# Patient Record
Sex: Female | Born: 1950 | Race: Black or African American | Hispanic: No | Smoking: Never smoker
Health system: Southern US, Community
[De-identification: ages and names within clinical notes are randomized; demographics above are authoritative.]

## PROBLEM LIST (undated history)

## (undated) DIAGNOSIS — I10 Essential (primary) hypertension: Secondary | ICD-10-CM

## (undated) DIAGNOSIS — F329 Major depressive disorder, single episode, unspecified: Secondary | ICD-10-CM

## (undated) DIAGNOSIS — F419 Anxiety disorder, unspecified: Secondary | ICD-10-CM

## (undated) DIAGNOSIS — F32A Depression, unspecified: Secondary | ICD-10-CM

## (undated) HISTORY — DX: Major depressive disorder, single episode, unspecified: F32.9

## (undated) HISTORY — DX: Essential (primary) hypertension: I10

## (undated) HISTORY — DX: Anxiety disorder, unspecified: F41.9

## (undated) HISTORY — DX: Depression, unspecified: F32.A

---

## 2002-10-30 ENCOUNTER — Other Ambulatory Visit: Admission: RE | Admit: 2002-10-30 | Discharge: 2002-10-30 | Payer: Self-pay | Admitting: Gynecology

## 2004-03-15 ENCOUNTER — Ambulatory Visit (HOSPITAL_COMMUNITY): Admission: RE | Admit: 2004-03-15 | Discharge: 2004-03-15 | Payer: Self-pay | Admitting: Urology

## 2010-02-07 ENCOUNTER — Encounter (INDEPENDENT_AMBULATORY_CARE_PROVIDER_SITE_OTHER): Payer: Self-pay | Admitting: *Deleted

## 2010-03-17 ENCOUNTER — Encounter (INDEPENDENT_AMBULATORY_CARE_PROVIDER_SITE_OTHER): Payer: Self-pay | Admitting: *Deleted

## 2010-03-17 ENCOUNTER — Ambulatory Visit: Payer: Self-pay | Admitting: Gastroenterology

## 2010-03-17 DIAGNOSIS — R1032 Left lower quadrant pain: Secondary | ICD-10-CM

## 2010-04-21 ENCOUNTER — Ambulatory Visit
Admission: RE | Admit: 2010-04-21 | Discharge: 2010-04-21 | Payer: Self-pay | Source: Home / Self Care | Attending: Gastroenterology | Admitting: Gastroenterology

## 2010-04-21 ENCOUNTER — Encounter: Payer: Self-pay | Admitting: Gastroenterology

## 2010-05-09 NOTE — Letter (Signed)
Summary: New Patient letter  [facility]  [address] [place], [place] [address]   Phone: [phone]  Fax: [phone]       02/07/2010 MRN: [id number]  Margaret Murphy [address] [place], [place]  [address]  Dear Ms. Steffek,  Welcome to the Gastroenterology Division at [facility].    You are scheduled to see Dr. Christella Hartigan on 03/17/2010 at 10:00AM on the 3rd floor at [facility], [address]  We ask that you try to arrive at our office 15 minutes prior to your appointment time to allow for check-in.  We would like you to complete the enclosed self-administered evaluation form prior to your visit and bring it with you on the day of your appointment.  We will review it with you.  Also, please bring a complete list of all your medications or, if you prefer, bring the medication bottles and we will list them.  Please bring your insurance card so that we may make a copy of it.  If your insurance requires a referral to see a specialist, please bring your referral form from your primary care physician.  Co-payments are due at the time of your visit and may be paid by cash, check or credit card.     Your office visit will consist of a consult with your physician (includes a physical exam), any laboratory testing he/she may order, scheduling of any necessary diagnostic testing (e.g. x-ray, ultrasound, CT-scan), and scheduling of a procedure (e.g. Endoscopy, Colonoscopy) if required.  Please allow enough time on your schedule to allow for any/all of these possibilities.    If you cannot keep your appointment, please call [phone] to cancel or reschedule prior to your appointment date.  This allows [name] the opportunity to schedule an appointment for another patient in need of care.  If you do not cancel or reschedule by 5 p.m. the business day prior to your appointment date, you will be charged a $50.00 late cancellation/no-show fee.    Thank you for choosing [facility] for your medical needs.  We appreciate the opportunity to care for you.  Please visit [name] at our website  to learn more about our practice.                     Sincerely,                                                             The Gastroenterology Division

## 2010-05-09 NOTE — Letter (Signed)
Summary: [facility] Instructions  [facility]  [address] [place], [place] [address]   Phone: [phone]  Fax: [phone]       Margaret Murphy    1951-01-27    MRN: [id number]        Procedure Day /Date:04/21/10  FRI     Arrival Time:230 pm     Procedure Time:330 pm     Location of Procedure:                    X  [facility] (4th Floor)                        PREPARATION FOR COLONOSCOPY WITH MOVIPREP   Starting 5 days prior to your procedure 04/16/10  do not eat nuts, seeds, popcorn, corn, beans, peas,  salads, or any raw vegetables.  Do not take any fiber supplements (e.g. Metamucil, Citrucel, and Benefiber).  THE DAY BEFORE YOUR PROCEDURE         DATE: 04/20/10  DAY: THURS  1.  Drink clear liquids the entire day-NO SOLID FOOD  2.  Do not drink anything colored red or purple.  Avoid juices with pulp.  No orange juice.  3.  Drink at least 64 oz. (8 glasses) of fluid/clear liquids during the day to prevent dehydration and help the prep work efficiently.  CLEAR LIQUIDS INCLUDE: Water Jello Ice Popsicles Tea (sugar ok, no milk/cream) Powdered fruit flavored drinks Coffee (sugar ok, no milk/cream) Gatorade Juice: apple, white grape, white cranberry  Lemonade Clear bullion, consomm, broth Carbonated beverages (any kind) Strained chicken noodle soup Hard Candy                             4.  In the morning, mix first dose of MoviPrep solution:    Empty 1 Pouch A and 1 Pouch B into the disposable container    Add lukewarm drinking water to the top line of the container. Mix to dissolve    Refrigerate (mixed solution should be used within 24 hrs)  5.  Begin drinking the prep at 5:00 p.m. The MoviPrep container is divided by 4 marks.   Every 15 minutes drink the solution down to the next mark (approximately 8 oz) until the full liter is complete.   6.  Follow completed prep with 16 oz of clear liquid of your choice (Nothing red or  purple).  Continue to drink clear liquids until bedtime.  7.  Before going to bed, mix second dose of MoviPrep solution:    Empty 1 Pouch A and 1 Pouch B into the disposable container    Add lukewarm drinking water to the top line of the container. Mix to dissolve    Refrigerate  THE DAY OF YOUR PROCEDURE      DATE: 04/21/10 DAY: THURS  Beginning at 1030 a.m. (5 hours before procedure):         1. Every 15 minutes, drink the solution down to the next mark (approx 8 oz) until the full liter is complete.  2. Follow completed prep with 16 oz. of clear liquid of your choice.    3. You may drink clear liquids until 130 pm  (2 HOURS BEFORE PROCEDURE).   MEDICATION INSTRUCTIONS  Unless otherwise instructed, you should take regular prescription medications with a small sip of water   as early as possible the morning of your  procedure.           OTHER INSTRUCTIONS  You will need a responsible adult at least 60 years of age to accompany you and drive you home.   This person must remain in the waiting room during your procedure.  Wear loose fitting clothing that is easily removed.  Leave jewelry and other valuables at home.  However, you may wish to bring a book to read or  an iPod/MP3 player to listen to music as you wait for your procedure to start.  Remove all body piercing jewelry and leave at home.  Total time from sign-in until discharge is approximately 2-3 hours.  You should go home directly after your procedure and rest.  You can resume normal activities the  day after your procedure.  The day of your procedure you should not:   Drive   Make legal decisions   Operate machinery   Drink alcohol   Return to work  You will receive specific instructions about eating, activities and medications before you leave.    The above instructions have been reviewed and explained to me by   _______________________    I fully understand and can verbalize these  instructions _____________________________ Date _________

## 2010-05-09 NOTE — Assessment & Plan Note (Signed)
History of Present Illness Visit Type: Initial Consult Primary GI MD: Margaret Bunting MD Primary Provider: Leslie Andrea, MD Requesting Provider: Harold Hedge, MD Chief Complaint: LLQ off/on History of Present Illness:     very pleasant 60 year old woman who has had LLQ discomforts for about 2-3 years, intermittent.  When it occurs it is cramp-like, can last for days.  Sometimes will waken her from sleep.  Moving her bowels ??effect on symptoms.  She is pretty regular with bowel, never bleeding.  Rare constipation at all.  Never had a colonoscopy.  Overall stable weight.           Current Medications (verified): 1)  Amlodipine Besylate 2.5 Mg Tabs (Amlodipine Besylate) .... Once Daily  Allergies (verified): 1)  ! Sulfa  Past History:  Past Medical History: Anemia Chronic Headaches Urinary Tract Infection Hypertension    Past Surgical History: c section 1989  Family History: no colon cancer Diabetes  Social History: she is married, she has 2 children, she is a retired Licensed conveyancer school, she does not smoke cigarettes or drink alcohol  Review of Systems       Pertinent positive and negative review of systems were noted in the above HPI and GI specific review of systems.  All other review of systems was otherwise negative.   Vital Signs:  Patient profile:   60 year old female Height:      64 inches Weight:      126 pounds BMI:     21.71 Pulse rate:   68 / minute Pulse rhythm:   regular BP sitting:   140 / 76  (left arm) Cuff size:   regular  Vitals Entered By: Margaret McMurray CMA Duncan Murphy) (March 17, 2010 10:00 AM)  Physical Exam  Additional Exam:  Constitutional: generally well appearing Psychiatric: alert and oriented times 3 Eyes: extraocular movements intact Mouth: oropharynx moist, no lesions Neck: supple, no lymphadenopathy Cardiovascular: heart regular rate and rythm Lungs: CTA bilaterally Abdomen: soft, non-tender, non-distended,  no obvious ascites, no peritoneal signs, normal bowel sounds Extremities: no lower extremity edema bilaterally Skin: no lesions on visible extremities    Impression & Recommendations:  Problem # 1:  left lower quadrant discomforts this may be diverticular in origin however none of her episodes really seems to be as significant as an acute diverticulitis. Perhaps this is simply IBS-like discomforts. She will try sub-lingual antispasmodics on an as-needed basis and we will arrange for a colonoscopy to be performed at her soonest convenience.  Patient Instructions: 1)  You will be scheduled to have a colonoscopy. 2)  Trial of antispasm meds, called into your pharmacy. 3)  The medication list was reviewed and reconciled.  All changed / newly prescribed medications were explained.  A complete medication list was provided to the patient / caregiver. Prescriptions: LEVSIN/SL 0.125 MG  SUBL (HYOSCYAMINE SULFATE) take one to two every 5-6 hours as needed for cramplike pains  #60 x 3   Entered and Authorized by:   Margaret Fee MD   Signed by:   Margaret Fee MD on 03/17/2010   Method used:   Electronically to        Margaret Murphy  [address] [id number]* (retail)       [address]       [place], [place]  [address]       Ph: [phone] or [phone]       Fax: [phone]  RxID:   [id number]   Appended Document: Orders Update/Movi    Clinical Lists Changes  Problems: Added new problem of ABDOMINAL PAIN, LEFT LOWER QUADRANT (ICD-789.04) Medications: Added new medication of MOVIPREP 100 GM  SOLR (PEG-KCL-NACL-NASULF-NA ASC-C) As per prep instructions. - Signed Rx of MOVIPREP 100 GM  SOLR (PEG-KCL-NACL-NASULF-NA ASC-C) As per prep instructions.;  #1 x 0;  Signed;  Entered by: Margaret Murphy CMA (AAMA);  Authorized by: Margaret Fee MD;  Method used: Electronically to [facility] # [id number]*, [address] [place], [place], [place]  [address], Ph:  [phone] or [phone], Fax: [phone] Orders: Added new Test order of Colonoscopy (Colon) - Signed    Prescriptions: MOVIPREP 100 GM  SOLR (PEG-KCL-NACL-NASULF-NA ASC-C) As per prep instructions.  #1 x 0   Entered by:   Margaret Murphy CMA (AAMA)   Authorized by:   Margaret Fee MD   Signed by:   Margaret Murphy CMA (AAMA) on 03/17/2010   Method used:   Electronically to        Margaret Murphy  [address] [id number]* (retail)       [address]       [place], [place]  [address]       Ph: [phone] or [phone]       Fax: [phone]   RxID:   [id number]

## 2010-05-11 NOTE — Procedures (Signed)
Summary: Colonoscopy  Patient: Margaret Murphy Note: All result statuses are Final unless otherwise noted.  Tests: (1) Colonoscopy (COL)   COL Colonoscopy           DONE     [facility]     [address]     [place], [place]  [address]           COLONOSCOPY PROCEDURE REPORT           PATIENT:  Margaret Murphy, Margaret Murphy  MR#:  [id number]     BIRTHDATE:  1950/04/27, 59 yrs. old  GENDER:  female     ENDOSCOPIST:  Rachael Fee, MD     REF. BY:  Harold Hedge, M.D.     PROCEDURE DATE:  04/21/2010     PROCEDURE:  Diagnostic Colonoscopy     ASA CLASS:  Class II     INDICATIONS:  intermittent left sided abd pains     MEDICATIONS:   Fentanyl 75 mcg IV, Versed 8 mg IV           DESCRIPTION OF PROCEDURE:   After the risks benefits and     alternatives of the procedure were thoroughly explained, informed     consent was obtained.  Digital rectal exam was performed and     revealed no rectal masses.   The LB [id number]-[id number] endoscope     was introduced through the anus and advanced to the cecum, which     was identified by both the appendix and ileocecal valve, without     limitations.  The quality of the prep was good, using MoviPrep.     The instrument was then slowly withdrawn as the colon was fully     examined.     <<PROCEDUREIMAGES>>     FINDINGS:  A normal appearing cecum, ileocecal valve, and     appendiceal orifice were identified. The ascending, hepatic     flexure, transverse, splenic flexure, descending, sigmoid colon,     and rectum appeared unremarkable (see image1, image2, and image3).     Retroflexed views in the rectum revealed no abnormalities.    The     scope was then withdrawn from the patient and the procedure     completed.     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 There is no need for FOBT (stool) testing for at     least 5 years.     She has not yet tried the antispasm meds but will at next     episode of left sided pains.           REPEAT EXAM:  10 years           ______________________________     Rachael Fee, MD           n.     eSIGNED:   Rachael Fee at 04/21/2010 03:31 PM           Donette Larry, [phone]  Note: An exclamation mark (!) indicates a result that was not dispersed into the flowsheet. Document Creation  Date: 04/21/2010 3:32 PM _______________________________________________________________________  (1) Order result status: Final Collection or observation date-time: 04/21/2010 15:26 Requested date-time:  Receipt date-time:  Reported date-time:  Referring Physician:   Ordering Physician: Rob Bunting [phone]) Specimen Source:  Source: Launa Grill Order Number: [id number] Lab site:   Appended Document: Colonoscopy    Clinical Lists Changes  Observations: Added new observation of COLONNXTDUE: 04/2020 (04/21/2010 16:06)

## 2010-09-11 ENCOUNTER — Emergency Department (HOSPITAL_COMMUNITY): Admission: EM | Admit: 2010-09-11 | Payer: Self-pay | Source: Home / Self Care

## 2011-10-09 ENCOUNTER — Ambulatory Visit (INDEPENDENT_AMBULATORY_CARE_PROVIDER_SITE_OTHER): Payer: BC Managed Care – PPO | Admitting: Family Medicine

## 2011-10-09 VITALS — BP 98/68 | HR 73 | Temp 98.6°F | Resp 16 | Ht 66.0 in | Wt 120.6 lb

## 2011-10-09 DIAGNOSIS — R634 Abnormal weight loss: Secondary | ICD-10-CM

## 2011-10-09 DIAGNOSIS — R12 Heartburn: Secondary | ICD-10-CM

## 2011-10-09 DIAGNOSIS — F4320 Adjustment disorder, unspecified: Secondary | ICD-10-CM

## 2011-10-09 LAB — TSH: TSH: 1.938 u[IU]/mL (ref 0.350–4.500)

## 2011-10-09 MED ORDER — ALPRAZOLAM 0.25 MG PO TABS: 0.2500 mg | ORAL_TABLET | Freq: Three times a day (TID) | ORAL | Status: DC | PRN

## 2011-10-09 NOTE — Patient Instructions (Signed)
Here are a few names for faith - based counseling. Normajean Glasgow at [facility]: [phone] Cephus Slater at [facility]: [phone] Laurell Roof at [facility]: [phone] [facility] [place]: [phone]  Your should receive a call or letter about your lab results within the next week to 10 days. You can take the Xanax -1/2 pill up to 2 pills three times per day as needed.  Recheck in the next 1-2 weeks.  Return to the clinic or go to the nearest emergency room if any of your symptoms worsen or new symptoms occur.

## 2011-10-09 NOTE — Progress Notes (Signed)
  Subjective:    Patient ID: Margaret Murphy, female    DOB: 14-May-1950, 61 y.o.   MRN: [id number]  HPI Margaret Murphy is a 61 y.o. female Separated from husband for 5 weeks. Didn't see it coming. Prior married for 6 years.  Financial issues. Decreased appetite.  Lost 9 pounds over past month. Not much appetite.  Not sleeping well. Waking up thinking up stressors. Just finished degree in [place] counseling. No prior depression/anxiety.  Decreased appetite - feels like food stuck at times when swallowing.  No SI. No Etoh.  Has tried unisom, min relief.     Review of Systems  Constitutional: Positive for unexpected weight change.  HENT: Positive for trouble swallowing.   Respiratory: Negative for choking.   Psychiatric/Behavioral: Positive for disturbed wake/sleep cycle. Negative for suicidal ideas and self-injury. The patient is nervous/anxious.        Objective:   Physical Exam  Constitutional: She is oriented to person, place, and time. She appears well-developed and well-nourished.       Thin, not cachectic.   HENT:  Head: Normocephalic and atraumatic.  Eyes: Conjunctivae are normal. Pupils are equal, round, and reactive to light.  Neck: No thyromegaly present.  Cardiovascular: Normal rate.   Pulmonary/Chest: Effort normal and breath sounds normal.  Neurological: She is alert and oriented to person, place, and time.  Skin: Skin is warm and dry.  Psychiatric: Her speech is normal. Judgment and thought content normal. Her affect is not labile and not inappropriate. She is slowed. Cognition and memory are normal. She exhibits a depressed mood. She expresses no suicidal ideation.    Counseling for 75% of OV - .       Assessment & Plan:  .Margaret Murphy is a 61 y.o. female 1. Weight loss  TSH  2. Heartburn    3. Adjustment disorder     Check TSH, but likely decreased appetite with adjustment disorder.  Encouraged to call counselor to schedule visit. Xanax 0.25mg  -  1/2 to 2 tid prn.  SED. Trial of otc Znatac prn heartburn, and recheck in next 1-2 weeks.   Patient Instructions  Here are a few names for faith - based counseling. Margaret Murphy at [facility]: [phone] Margaret Murphy at [facility]: [phone] Margaret Murphy at [facility]: [phone] [facility] in [place]: [phone]  Your should receive a call or letter about your lab results within the next week to 10 days. You can take the Xanax -1/2 pill up to 2 pills three times per day as needed.  Recheck in the next 1-2 weeks.  Return to the clinic or go to the nearest emergency room if any of your symptoms worsen or new symptoms occur.

## 2011-10-29 ENCOUNTER — Ambulatory Visit (INDEPENDENT_AMBULATORY_CARE_PROVIDER_SITE_OTHER): Payer: BC Managed Care – PPO | Admitting: Family Medicine

## 2011-10-29 VITALS — BP 131/75 | HR 78 | Temp 98.1°F | Resp 16 | Ht 66.0 in | Wt 118.0 lb

## 2011-10-29 DIAGNOSIS — F329 Major depressive disorder, single episode, unspecified: Secondary | ICD-10-CM

## 2011-10-29 DIAGNOSIS — F3289 Other specified depressive episodes: Secondary | ICD-10-CM

## 2011-10-29 MED ORDER — ALPRAZOLAM 0.25 MG PO TABS: 0.2500 mg | ORAL_TABLET | Freq: Three times a day (TID) | ORAL | Status: AC | PRN

## 2011-10-29 MED ORDER — PAROXETINE HCL 20 MG PO TABS: 20.0000 mg | ORAL_TABLET | ORAL | Status: DC

## 2011-10-29 NOTE — Progress Notes (Signed)
  Subjective:    Patient ID: Margaret Murphy, female    DOB: 1950-06-07, 61 y.o.   MRN: [id number]  HPI Margaret Murphy is a 61 y.o. female Seen 10/09/11 with adjustment disorder.  Rx Xanax and numbers given for counseling.  Feels about the same. Has had 2 counselors.  Still with difficulty sleeping, decreased appetite. Took xanax - 1/2 pill to help sleep - helped some, but would wake up early. Better with full pill.  Few times during the day - if feeling . Counselor - Margaret Murphy at [facility].  Will be financially challenging to continue counseling. Has family she can talk to about concerns.  Still feels down.  No suicide thoughts.    Retired.  volunteer work on wednesdays only.  No routine exercise.  Review of Systems Per hpi.    Objective:   Physical Exam  Constitutional: She is oriented to person, place, and time. She appears well-developed and well-nourished.  HENT:  Head: Normocephalic and atraumatic.  Pulmonary/Chest: Effort normal.  Neurological: She is alert and oriented to person, place, and time.  Psychiatric: Her speech is normal and behavior is normal. Judgment and thought content normal. Her affect is blunt. Cognition and memory are normal. She exhibits a depressed mood. She expresses no suicidal ideation.       Blunted affect and mood congruent - depressed mood, fair eye contact.  No PMA.  Interactive, appropriate in responses.        Counseling greater than 75% of visit - approx 15-20 minutes.     Assessment & Plan:  Margaret Murphy is a 61 y.o. female 1. Depression  ALPRAZolam (XANAX) 0.25 MG tablet, PARoxetine (PAXIL) 20 MG tablet    Depression with adjustment disorder, decreased appetite.  Stagnant symptoms, with persistent anhedonia.  Discussed active role in management and improvement of symptoms, including increasing exercise and volunteering to keep occupied/active (as volunteering has been beneficial and she enjoys this) Continue counseling. Start Paxil  20mg  QD - # 30, 1 rf. Continue xanax 0.25mg  TID prn - # 30, no rf. Recheck in next 2-3 weeks, sooner if needed.

## 2011-12-19 ENCOUNTER — Ambulatory Visit (INDEPENDENT_AMBULATORY_CARE_PROVIDER_SITE_OTHER): Payer: BC Managed Care – PPO | Admitting: Family Medicine

## 2011-12-19 ENCOUNTER — Encounter: Payer: Self-pay | Admitting: Family Medicine

## 2011-12-19 ENCOUNTER — Telehealth: Payer: Self-pay | Admitting: *Deleted

## 2011-12-19 VITALS — BP 138/68 | HR 73 | Temp 98.1°F | Resp 16 | Ht 65.0 in | Wt 117.8 lb

## 2011-12-19 DIAGNOSIS — F341 Dysthymic disorder: Secondary | ICD-10-CM

## 2011-12-19 DIAGNOSIS — Z0000 Encounter for general adult medical examination without abnormal findings: Secondary | ICD-10-CM

## 2011-12-19 DIAGNOSIS — E7800 Pure hypercholesterolemia, unspecified: Secondary | ICD-10-CM

## 2011-12-19 DIAGNOSIS — F418 Other specified anxiety disorders: Secondary | ICD-10-CM

## 2011-12-19 DIAGNOSIS — I10 Essential (primary) hypertension: Secondary | ICD-10-CM

## 2011-12-19 DIAGNOSIS — Z87440 Personal history of urinary (tract) infections: Secondary | ICD-10-CM | POA: Insufficient documentation

## 2011-12-19 DIAGNOSIS — N390 Urinary tract infection, site not specified: Secondary | ICD-10-CM

## 2011-12-19 LAB — POCT URINALYSIS DIPSTICK
Bilirubin, UA: NEGATIVE
Ketones, UA: NEGATIVE
Protein, UA: NEGATIVE
Spec Grav, UA: 1.02
Urobilinogen, UA: 1
pH, UA: 7

## 2011-12-19 LAB — LIPID PANEL
HDL: 88 mg/dL (ref >39)
LDL Cholesterol: 129 mg/dL — ABNORMAL HIGH (ref 0–99)
Total CHOL/HDL Ratio: 2.6 Ratio
Triglycerides: 44 mg/dL (ref <150)
VLDL: 9 mg/dL (ref 0–40)

## 2011-12-19 LAB — POCT UA - MICROSCOPIC ONLY
Casts, Ur, LPF, POC: NEGATIVE
Crystals, Ur, HPF, POC: NEGATIVE

## 2011-12-19 LAB — BASIC METABOLIC PANEL
BUN: 19 mg/dL (ref 6–23)
CO2: 29 mEq/L (ref 19–32)
Calcium: 10 mg/dL (ref 8.4–10.5)
Chloride: 105 mEq/L (ref 96–112)
Creat: 0.83 mg/dL (ref 0.50–1.10)
Glucose, Bld: 100 mg/dL — ABNORMAL HIGH (ref 70–99)
Potassium: 4.5 mEq/L (ref 3.5–5.3)
Sodium: 141 mEq/L (ref 135–145)

## 2011-12-19 LAB — ALT: ALT: 16 U/L (ref 0–35)

## 2011-12-19 MED ORDER — AMLODIPINE BESYLATE 5 MG PO TABS: 2.5000 mg | ORAL_TABLET | Freq: Every day | ORAL | Status: DC

## 2011-12-19 MED ORDER — PAROXETINE HCL 20 MG PO TABS: 20.0000 mg | ORAL_TABLET | ORAL | Status: DC

## 2011-12-19 MED ORDER — CEFUROXIME AXETIL 250 MG PO TABS: 250.0000 mg | ORAL_TABLET | Freq: Two times a day (BID) | ORAL | Status: AC

## 2011-12-19 MED ORDER — CICLOPIROX OLAMINE 0.77 % EX CREA: TOPICAL_CREAM | CUTANEOUS | Status: DC | PRN

## 2011-12-19 MED ORDER — ALPRAZOLAM 0.25 MG PO TABS: 0.2500 mg | ORAL_TABLET | Freq: Every evening | ORAL | Status: DC | PRN

## 2011-12-19 NOTE — Patient Instructions (Signed)
Keeping You Healthy  Get These Tests  Blood Pressure- Have your blood pressure checked by your healthcare provider at least once a year.  Normal blood pressure is 120/80.  Weight- Have your body mass index (BMI) calculated to screen for obesity.  BMI is a measure of body fat based on height and weight.  You can calculate your own BMI at https://www.west-esparza.com/  Cholesterol- Have your cholesterol checked every year.  Diabetes- Have your blood sugar checked every year if you have high blood pressure, high cholesterol, a family history of diabetes or if you are overweight.  Pap Smear- Have a pap smear every 1 to 3 years if you have been sexually active.  If you are older than 65 and recent pap smears have been normal you may not need additional pap smears.  In addition, if you have had a hysterectomy  For benign disease additional pap smears are not necessary.  Mammogram-Yearly mammograms are essential for early detection of breast cancer  Screening for Colon Cancer- Colonoscopy starting at age 41. Screening may begin sooner depending on your family history and other health conditions.  Follow up colonoscopy as directed by your Gastroenterologist.  Screening for Osteoporosis- Screening begins at age 23 with bone density scanning, sooner if you are at higher risk for developing Osteoporosis.  Get these medicines  Calcium with Vitamin D- Your body requires 1200-1500 mg of Calcium a day and [phone] IU of Vitamin D a day.  You can only absorb 500 mg of Calcium at a time therefore Calcium must be taken in 2 or 3 separate doses throughout the day.  Hormones- Hormone therapy has been associated with increased risk for certain cancers and heart disease.  Talk to your healthcare provider about if you need relief from menopausal symptoms.  Aspirin- Ask your healthcare provider about taking Aspirin to prevent Heart Disease and Stroke.  Get these Immuniztions  Flu shot- Every fall  Pneumonia  shot- Once after the age of 89; if you are younger ask your healthcare provider if you need a pneumonia shot.  Tetanus- Every ten years. Tdap was given in July 2007; next dose of regular Tetanus due in 2017.  Zostavax- Once after the age of 67 to prevent shingles. You have been given an RX: Zostavax to take to [facility] or [facility] at [facility] (the pharmacist will administer the vaccine).  Take these steps  Don't smoke- Your healthcare provider can help you quit. For tips on how to quit, ask your healthcare provider or go to www.smokefree.gov or call 1-800 QUIT-NOW.  Be physically active- Exercise 5 days a week for a minimum of 30 minutes.  If you are not already physically active, start slow and gradually work up to 30 minutes of moderate physical activity.  Try walking, dancing, bike riding, swimming, etc.  Eat a healthy diet- Eat a variety of healthy foods such as fruits, vegetables, whole grains, low fat milk, low fat cheeses, yogurt, lean meats, chicken, fish, eggs, dried beans, tofu, etc.  For more information go to www.thenutritionsource.org  Dental visit- Brush and floss teeth twice daily; visit your dentist twice a year.  Eye exam- Visit your Optometrist or Ophthalmologist yearly.  Drink alcohol in moderation- Limit alcohol intake to one drink or less a day.  Never drink and drive.  Depression- Your emotional health is as important as your physical health.  If you're feeling down or losing interest in things you normally enjoy, please talk to your healthcare provider.  Seat Belts- can save your life; always wear one  Smoke/Carbon Monoxide detectors- These detectors need to be installed on the appropriate level of your home.  Replace batteries at least once a year.  Violence- If anyone is threatening or hurting you, please tell your healthcare provider.  Living Will/ Health care power of attorney- Discuss with your healthcare provider and  family.   Shingles Shingles is caused by the same virus that causes chickenpox (varicella zoster virus or VZV). Shingles often occurs many years or decades after having chickenpox. That is why it is more common in adults older than 50 years. The virus reactivates and breaks out as an infection in a nerve root. SYMPTOMS   The initial feeling (sensations) may be pain. This pain is usually described as:   Burning.   Stabbing.   Throbbing.   Tingling in the nerve root.   A red rash will follow in a couple days. The rash may occur in any area of the body and is usually on one side (unilateral) of the body in a band or belt-like pattern. The rash usually starts out as very small blisters (vesicles). They will dry up after 7 to 10 days. This is not usually a significant problem except for the pain it causes.   Long-lasting (chronic) pain is more likely in an elderly person. It can last months to years. This condition is called postherpetic neuralgia.  Shingles can be an extremely severe infection in someone with AIDS, a weakened immune system, or with forms of leukemia. It can also be severe if you are taking transplant medicines or other medicines that weaken the immune system. TREATMENT  Your caregiver will often treat you with:  Antiviral drugs.   Anti-inflammatory drugs.   Pain medicines.  Bed rest is very important in preventing the pain associated with herpes zoster (postherpetic neuralgia). Application of heat in the form of a hot water bottle or electric heating pad or gentle pressure with the hand is recommended to help with the pain or discomfort. PREVENTION  A varicella zoster vaccine is available to help protect against the virus. The Food and Drug Administration approved the varicella zoster vaccine for individuals 60 years of age and older. HOME CARE INSTRUCTIONS   Cool compresses to the area of rash may be helpful.   Only take over-the-counter or prescription medicines for  pain, discomfort, or fever as directed by your caregiver.   Avoid contact with:   Babies.   Pregnant women.   Children with eczema.   Elderly people with transplants.   People with chronic illnesses, such as leukemia and AIDS.   If the area involved is on your face, you may receive a referral for follow-up to a specialist. It is very important to keep all follow-up appointments. This will help avoid eye complications, chronic pain, or disability.  SEEK IMMEDIATE MEDICAL CARE IF:   You develop any pain (headache) in the area of the face or eye. This must be followed carefully by your caregiver or ophthalmologist. An infection in part of your eye (cornea) can be very serious. It could lead to blindness.   You do not have pain relief from prescribed medicines.   Your redness or swelling spreads.   The area involved becomes very swollen and painful.   You have a fever.   You notice any red or painful lines extending away from the affected area toward your heart (lymphangitis).   Your condition is worsening or has changed.  Document  Released: 03/26/2005 Document Revised: 03/15/2011 Document Reviewed: 02/28/2009 [facility] Patient Information 2012 [place], [place].

## 2011-12-19 NOTE — Progress Notes (Signed)
Subjective:    Patient ID: Margaret Murphy, female    DOB: 02-18-1951, 61 y.o.   MRN: [id number]  HPI   This 61 y.o. AA female is her for CPE (no PAP).  She has HTN which is controlled; she denies  any side effects with Amlodipine. She measures her BP occasionally, only if she "feels like it is up".  Treatment for depression and anxiety was initiated in July; pt feels the medications are helping as  she works through some problematic issues in her life. She would like to wean after the meds  after the first of the year.    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 No alcohol and no tobacco. Exercise-"sometimes".   Last PAP: 2011 (normal)  Last MMG: sch for this month.  Last CRS; within 10 years (normal)    Review of Systems  Constitutional: Positive for appetite change. Negative for fever, fatigue and unexpected weight change.  HENT: Negative.   Respiratory: Negative.   Cardiovascular: Negative.   Gastrointestinal: Negative.   Genitourinary: Positive for frequency. Negative for hematuria, vaginal discharge, difficulty urinating and pelvic pain.       Hx of UTIs (> 5 in 40 years); previously eval by Dr.Nesi (nothing found)  Musculoskeletal: Negative.   Skin: Negative.   Neurological: Negative.   Hematological: Negative.   Psychiatric/Behavioral: Negative.        Objective:   Physical Exam  Nursing note and vitals reviewed. Constitutional: She is oriented to person, place, and time. She appears well-developed and well-nourished. No distress.  HENT:  Head: Normocephalic and atraumatic.  Right Ear: Hearing, tympanic membrane, external ear and ear canal normal.  Left Ear: Hearing, tympanic membrane, external ear and ear canal normal.  Nose: Nose normal. No nasal deformity or septal deviation.  Mouth/Throat: Uvula is midline, oropharynx is clear and moist and mucous membranes are normal. No oral lesions. Normal dentition. No dental caries.  Eyes: Conjunctivae normal, EOM and lids  are normal. Pupils are equal, round, and reactive to light. No scleral icterus.       Regular exams by eye care professional.  Neck: Normal range of motion. Neck supple. No thyromegaly present.  Cardiovascular: Normal rate, regular rhythm, normal heart sounds and intact distal pulses.  Exam reveals no gallop and no friction rub.   No murmur heard. Pulmonary/Chest: Effort normal and breath sounds normal. No respiratory distress. She exhibits no tenderness. Right breast exhibits no inverted nipple, no mass, no nipple discharge, no skin change and no tenderness. Left breast exhibits no inverted nipple, no mass, no nipple discharge, no skin change and no tenderness. Breasts are symmetrical.  Abdominal: Soft. Bowel sounds are normal. She exhibits no abdominal bruit, no pulsatile midline mass and no mass. There is no hepatosplenomegaly. There is no tenderness. There is no guarding and no CVA tenderness.  Genitourinary:       Exam deferred  Musculoskeletal: Normal range of motion. She exhibits no edema and no tenderness.  Lymphadenopathy:    She has no cervical adenopathy.  Neurological: She is alert and oriented to person, place, and time. She has normal reflexes. No cranial nerve deficit. She exhibits normal muscle tone. Coordination normal.  Skin: Skin is warm and dry.  Psychiatric: She has a normal mood and affect. Her behavior is normal. Judgment and thought content normal.      Results for orders placed in visit on 12/19/11  POCT URINALYSIS DIPSTICK      Component Value Range   Color,  UA yellow     Clarity, UA clear     Glucose, UA neg     Bilirubin, UA neg     Ketones, UA neg     Spec Grav, UA 1.020     Blood, UA trace     pH, UA 7.0     Protein, UA neg     Urobilinogen, UA 1.0     Nitrite, UA positive     Leukocytes, UA Trace    POCT UA - MICROSCOPIC ONLY      Component Value Range   WBC, Ur, HPF, POC 0-6     RBC, urine, microscopic 0-3     Bacteria, U Microscopic 2+      Mucus, UA small     Epithelial cells, urine per micros 0-4     Crystals, Ur, HPF, POC neg     Casts, Ur, LPF, POC neg     Yeast, UA neg          Assessment & Plan:   1. Routine general medical examination at a health care facility    2. HTN (hypertension)  Basic metabolic panel RX: Amlodipine 5 mg daily  1/2 tab daily  3. Depression with anxiety  ZO:XWRUEAVWUJ (PAXIL) 20 MG tablet RF: Alprazolam 0.25 mg  Advised avoiding abrupt discontinuation of Paroxetine; will begin weaning pt off med after holidays if she feels ready.  4. Urinary tract infection, site not specified  Urine culture  5. Hypercholesteremia  Lipid panel, ALT

## 2011-12-19 NOTE — Telephone Encounter (Signed)
Called patient to advise her that Dr. Audria Nine e-scribed her ciclopirox cream to her [facility] on [address] For pick-up. Margaret Murphy, Marion Downer

## 2011-12-22 LAB — URINE CULTURE: Colony Count: >=100000

## 2011-12-26 ENCOUNTER — Other Ambulatory Visit: Payer: Self-pay | Admitting: Family Medicine

## 2011-12-26 NOTE — Progress Notes (Signed)
Quick Note:  Please call pt and advise that the following labs are abnormal...  Urine culture confirms UTI (pt has had these in past). I prescribed an antibiotic at her office visit. After taking the medication, she will need to [facility] for recheck of urine. Schedule her to come in the 1st week of October.    ______

## 2011-12-31 ENCOUNTER — Telehealth: Payer: Self-pay

## 2011-12-31 NOTE — Telephone Encounter (Signed)
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 Please call at [phone] when ready for pick up.

## 2011-12-31 NOTE — Telephone Encounter (Signed)
This paperwork is at [employer].-

## 2012-01-01 NOTE — Telephone Encounter (Signed)
PPW taken to Dr Audria Nine at 104 to complete/sign.

## 2012-01-01 NOTE — Telephone Encounter (Signed)
Form completed; staff at [address] will contact pt to pick up form on 01/02/2012.

## 2012-01-02 ENCOUNTER — Telehealth: Payer: Self-pay | Admitting: Family Medicine

## 2012-01-02 NOTE — Telephone Encounter (Signed)
Contacted pt; form ready for pick-up at 104.

## 2012-04-24 ENCOUNTER — Ambulatory Visit: Payer: BC Managed Care – PPO | Admitting: Family Medicine

## 2012-12-26 ENCOUNTER — Ambulatory Visit (INDEPENDENT_AMBULATORY_CARE_PROVIDER_SITE_OTHER): Payer: BC Managed Care – PPO | Admitting: Family Medicine

## 2012-12-26 ENCOUNTER — Encounter: Payer: Self-pay | Admitting: Family Medicine

## 2012-12-26 VITALS — BP 120/76 | HR 74 | Temp 98.3°F | Resp 16 | Ht 65.0 in | Wt 123.6 lb

## 2012-12-26 DIAGNOSIS — E7800 Pure hypercholesterolemia, unspecified: Secondary | ICD-10-CM

## 2012-12-26 DIAGNOSIS — F341 Dysthymic disorder: Secondary | ICD-10-CM

## 2012-12-26 DIAGNOSIS — T887XXA Unspecified adverse effect of drug or medicament, initial encounter: Secondary | ICD-10-CM

## 2012-12-26 DIAGNOSIS — F418 Other specified anxiety disorders: Secondary | ICD-10-CM

## 2012-12-26 DIAGNOSIS — Z0000 Encounter for general adult medical examination without abnormal findings: Secondary | ICD-10-CM

## 2012-12-26 DIAGNOSIS — I10 Essential (primary) hypertension: Secondary | ICD-10-CM

## 2012-12-26 DIAGNOSIS — T50905A Adverse effect of unspecified drugs, medicaments and biological substances, initial encounter: Secondary | ICD-10-CM

## 2012-12-26 LAB — COMPREHENSIVE METABOLIC PANEL
AST: 26 U/L (ref 0–37)
Alkaline Phosphatase: 41 U/L (ref 39–117)
BUN: 18 mg/dL (ref 6–23)
CO2: 27 mEq/L (ref 19–32)
Calcium: 9.5 mg/dL (ref 8.4–10.5)
Chloride: 107 mEq/L (ref 96–112)
Creat: 0.8 mg/dL (ref 0.50–1.10)
Glucose, Bld: 82 mg/dL (ref 70–99)
Potassium: 3.8 mEq/L (ref 3.5–5.3)
Sodium: 140 mEq/L (ref 135–145)
Total Bilirubin: 0.4 mg/dL (ref 0.3–1.2)
Total Protein: 6.8 g/dL (ref 6.0–8.3)

## 2012-12-26 LAB — LIPID PANEL
Cholesterol: 224 mg/dL — ABNORMAL HIGH (ref 0–200)
HDL: 73 mg/dL (ref >39)
LDL Cholesterol: 143 mg/dL — ABNORMAL HIGH (ref 0–99)
Total CHOL/HDL Ratio: 3.1 Ratio
Triglycerides: 41 mg/dL (ref <150)
VLDL: 8 mg/dL (ref 0–40)

## 2012-12-26 MED ORDER — AMLODIPINE BESYLATE 2.5 MG PO TABS: 2.5000 mg | ORAL_TABLET | Freq: Every day | ORAL | Status: DC

## 2012-12-26 MED ORDER — ALPRAZOLAM 0.25 MG PO TABS: ORAL_TABLET | ORAL | Status: DC

## 2012-12-26 NOTE — Progress Notes (Signed)
Subjective:    Patient ID: Margaret Murphy, female    DOB: 01-04-1951, 62 y.o.   MRN: [id number]  HPI  This 62 y.o. AA female is here for CPE.  She has chronic anxiety and personal issues that she is coping with  without medication. Paroxetine was prescribed last year but pt did not take this medication for long; it was ineffective and pt is adverse to chronic medication use unless necessary. Alprazolam is used very sparingly for insomnia. HTN medication caused dizziness and some mild fatigue; pt takes this med every other day. Pt has some mild R hand stiffness (R hand dominant); pt does a lot of work in yard/garden. She is retired Runner, broadcasting/film/video.   HCM: PAP- 2011- negative).           MMG- 11/2012- normal; dense breast tissue noted.           CRS- 2012; advised by GI to have in-office stool check every 5 yrs.           Vision- Annually.           Dental- Every 6 months.           IMM- Current. Needs Zostavax.    Patient Active Problem List   Diagnosis Date Noted  . HTN (hypertension) 12/19/2011  . Depression with anxiety 12/19/2011  . Hypercholesteremia 12/19/2011  . History of urinary tract infection 12/19/2011    Review of Systems  Constitutional: Negative.   HENT: Negative.   Eyes: Negative.   Respiratory: Negative.   Cardiovascular: Negative.   Gastrointestinal: Negative.   Endocrine: Negative.   Genitourinary: Negative.   Musculoskeletal: Negative.   Skin: Negative.   Allergic/Immunologic: Negative.   Neurological: Negative.   Hematological: Negative.   Psychiatric/Behavioral: Positive for sleep disturbance and dysphoric mood.       Objective:   Physical Exam  Nursing note and vitals reviewed. Constitutional: She is oriented to person, place, and time. Vital signs are normal. She appears well-developed and well-nourished. No distress.  HENT:  Head: Normocephalic and atraumatic.  Right Ear: Tympanic membrane, external ear and ear canal normal. No decreased hearing is  noted.  Left Ear: Hearing, tympanic membrane, external ear and ear canal normal. No decreased hearing is noted.  Nose: Nose normal. No nasal deformity or septal deviation.  Mouth/Throat: Uvula is midline, oropharynx is clear and moist and mucous membranes are normal. No oral lesions. Normal dentition.  Eyes: Conjunctivae, EOM and lids are normal. Pupils are equal, round, and reactive to light. No scleral icterus.  Fundoscopic exam:      The right eye shows no papilledema. The right eye shows red reflex.       The left eye shows no papilledema. The left eye shows red reflex.  Recent exam w/ eye care specialist.  Neck: Normal range of motion and full passive range of motion without pain. Neck supple. No JVD present. No spinous process tenderness and no muscular tenderness present. Normal range of motion present. No mass and no thyromegaly present.  Cardiovascular: Normal rate, regular rhythm, S1 normal, S2 normal, normal heart sounds and normal pulses.   No extrasystoles are present. PMI is not displaced.  Exam reveals no gallop, no distant heart sounds and no friction rub.   No murmur heard. Pulmonary/Chest: Effort normal and breath sounds normal. No respiratory distress.  Abdominal: Soft. Normal appearance and bowel sounds are normal. She exhibits no distension, no abdominal bruit, no pulsatile midline mass and no mass. There is no hepatosplenomegaly.  There is no tenderness. There is no guarding and no CVA tenderness. A hernia is present.  Genitourinary:  Exam deferred.  Musculoskeletal:       Right shoulder: Normal.       Left shoulder: Normal.       Right elbow: Normal.      Left elbow: Normal.       Right wrist: Normal.       Left wrist: Normal.       Right hip: Normal.       Left hip: Normal.       Right ankle: Normal.       Left ankle: Normal.       Cervical back: Normal.       Thoracic back: Normal.       Lumbar back: Normal.       Right hand: She exhibits normal range of motion,  no tenderness, no bony tenderness, no deformity and no swelling. Normal sensation noted. Normal strength noted.       Left hand: She exhibits normal range of motion, no tenderness, no bony tenderness, no deformity and no swelling. Normal sensation noted. Normal strength noted.  Lymphadenopathy:       Head (right side): No submental, no submandibular, no tonsillar, no posterior auricular and no occipital adenopathy present.       Head (left side): No submental, no submandibular, no tonsillar, no posterior auricular and no occipital adenopathy present.    She has no cervical adenopathy.    She has no axillary adenopathy.       Right: No inguinal and no supraclavicular adenopathy present.       Left: No inguinal and no supraclavicular adenopathy present.  Neurological: She is alert and oriented to person, place, and time. She has normal strength and normal reflexes. She displays no atrophy. No cranial nerve deficit or sensory deficit. She exhibits normal muscle tone. Coordination and gait normal.  Skin: Skin is warm, dry and intact. No ecchymosis, no lesion and no rash noted. She is not diaphoretic. No cyanosis or erythema. No pallor. Nails show no clubbing.  Psychiatric: She has a normal mood and affect. Her speech is normal and behavior is normal. Judgment and thought content normal. Cognition and memory are normal.    ECG: NSR; nonspecific septal T wave changes.      Assessment & Plan:  Routine general medical examination at a health care facility - Plan: Vit D  25 hydroxy (rtn osteoporosis monitoring), Comprehensive metabolic panel, Lipid panel, TSH, T3, free, EKG 12-Lead  HTN (hypertension)- Reduce Amlodipine dose to 2.5 mg 1 tab daily.  Hypercholesteremia- Pt has high HDL w/ CAD risk ratio = 2.8.  Depression with anxiety- Continue Alprazolam prn; pt feels she is coping w/ problems and stressors w/o chronic SSRI.  Medication side effect, initial encounter- Medication as noted above.  Recheck in 3-4 months.  Meds ordered this encounter  Medications  . ALPRAZolam (XANAX) 0.25 MG tablet    Sig: Take 1/2 - 1 tablet prn anxiety or sleep.    Dispense:  30 tablet    Refill:  1  . amLODipine (NORVASC) 2.5 MG tablet    Sig: Take 1 tablet (2.5 mg total) by mouth daily.    Dispense:  90 tablet    Refill:  1

## 2012-12-26 NOTE — Patient Instructions (Signed)

## 2012-12-27 LAB — VITAMIN D 25 HYDROXY (VIT D DEFICIENCY, FRACTURES): Vit D, 25-Hydroxy: 24 ng/mL — ABNORMAL LOW (ref 30–89)

## 2012-12-27 LAB — TSH: TSH: 2.1 u[IU]/mL (ref 0.350–4.500)

## 2012-12-30 NOTE — Progress Notes (Signed)
Quick Note:  Please advise pt regarding following labs...  Vitamin D level is below normal. Get a supplement Vitamin D3 2000 IU and take 1 tablet or capsule daily. Get some sun exposure daily for 10-15 minutes. Include more Vitamin D foods in your diet: salmon, tuna, mackerel, mushrooms, eggs and some dairy products.   Lipid panel is the same as last year.; HDL is still quite high. Thyroid test results are normal. Continue all current medications, healthy nutrition and active lifestyle.  Copy to pt. ______

## 2013-03-26 ENCOUNTER — Encounter: Payer: Self-pay | Admitting: Family Medicine

## 2013-03-26 ENCOUNTER — Ambulatory Visit (INDEPENDENT_AMBULATORY_CARE_PROVIDER_SITE_OTHER): Payer: BC Managed Care – PPO | Admitting: Family Medicine

## 2013-03-26 VITALS — BP 140/76 | HR 59 | Temp 98.2°F | Resp 16 | Ht 65.0 in | Wt 124.6 lb

## 2013-03-26 DIAGNOSIS — T50905A Adverse effect of unspecified drugs, medicaments and biological substances, initial encounter: Secondary | ICD-10-CM

## 2013-03-26 DIAGNOSIS — R5381 Other malaise: Secondary | ICD-10-CM

## 2013-03-26 DIAGNOSIS — I10 Essential (primary) hypertension: Secondary | ICD-10-CM

## 2013-03-26 DIAGNOSIS — Z23 Encounter for immunization: Secondary | ICD-10-CM

## 2013-03-26 LAB — BASIC METABOLIC PANEL
BUN: 19 mg/dL (ref 6–23)
CO2: 30 mEq/L (ref 19–32)
Creat: 0.82 mg/dL (ref 0.50–1.10)
Potassium: 4.2 mEq/L (ref 3.5–5.3)

## 2013-03-26 MED ORDER — ZOSTER VACCINE LIVE 19400 UNT/0.65ML ~~LOC~~ SOLR: 0.6500 mL | Freq: Once | SUBCUTANEOUS | Status: DC

## 2013-03-26 MED ORDER — CICLOPIROX OLAMINE 0.77 % EX CREA: TOPICAL_CREAM | CUTANEOUS | Status: DC | PRN

## 2013-03-26 NOTE — Patient Instructions (Addendum)
Hypokalemia Hypokalemia means a low potassium level in the blood.Potassium is an electrolyte that helps regulate the amount of fluid in the body. It also stimulates muscle contraction and maintains a stable acid-base balance.Most of the body's potassium is inside of cells, and only a very small amount is in the blood. Because the amount in the blood is so small, minor changes can have big effects. PREPARATION FOR TEST Testing for potassium requires taking a blood sample taken by needle from a vein in the arm. The skin is cleaned thoroughly before the sample is drawn. There is no other special preparation needed. NORMAL VALUES Potassium levels below 3.5 mEq/L are abnormally low. Levels above 5.1 mEq/L are abnormally high. Ranges for normal findings may vary among different laboratories and hospitals. You should always check with your doctor after having lab work or other tests done to discuss the meaning of your test results and whether your values are considered within normal limits. MEANING OF TEST  Your caregiver will go over the test results with you and discuss the importance and meaning of your results, as well as treatment options and the need for additional tests, if necessary. A potassium level is frequently part of a routine medical exam. It is usually included as part of a whole "panel" of tests for several blood salts (such as Sodium and Chloride). It may be done as part of follow-up when a low potassium level was found in the past or other blood salts are suspected of being out of balance. A low potassium level might be suspected if you have one or more of the following:  Symptoms of weakness.  Abnormal heart rhythms.  High blood pressure and are taking medication to control this, especially water pills (diuretics).  Kidney disease that can affect your potassium level .  Diabetes requiring the use of insulin. The potassium may fall after taking insulin, especially if the diabetes  had been out of control for a while.  A condition requiring the use of cortisone-type medication or certain types of antibiotics.  Vomiting and/or diarrhea for more than a day or two.  A stomach or intestinal condition that may not permit appropriate absorption of potassium.  Fainting episodes.  Mental confusion. OBTAINING TEST RESULTS It is your responsibility to obtain your test results. Ask the lab or department performing the test when and how you will get your results.  Please contact your caregiver directly if you have not received the results within one week. At that time, ask if there is anything different or new you should be doing in relation to the results. TREATMENT Hypokalemia can be treated with potassium supplements taken by mouth and/or adjustments in your current medications. A diet high in potassium is also helpful. Foods with high potassium content are:  Peas, lentils, lima beans, nuts, and dried fruit.  Whole grain and bran cereals and breads.  Fresh fruit, vegetables (bananas, cantaloupe, grapefruit, oranges, tomatoes, honeydew melons, potatoes).  Orange and tomato juices.  Meats. If potassium supplement has been prescribed for you today or your medications have been adjusted, see your personal caregiver in time02 for a re-check. SEEK MEDICAL CARE IF:  There is a feeling of worsening weakness.  You experience repeated chest palpitations.  You are diabetic and having difficulty keeping your blood sugars in the normal range.  You are experiencing vomiting and/or diarrhea.  You are having difficulty with any of your regular medications. SEEK IMMEDIATE MEDICAL CARE IF:  You experience chest pain, shortness of   breath, or episodes of dizziness.  You have been having vomiting or diarrhea for more than 2 days.  You have a fainting episode. MAKE SURE YOU:   Understand these instructions.  Will watch your condition.  Will get help right away if you are  not doing well or get worse. Document Released: 03/26/2005 Document Revised: 06/18/2011 Document Reviewed: 09/26/2012 [facility] Patient Information 2014 [place], [place].  Check your blood pressure 1-2 times a day. Readings on the top should be no higher than 140-150. If you get readings consistently above 150 and you start having symptoms (chest pain, tightness, palpitations, shortness of breath or exertional symptoms- as we discussed), contact the office for an earlier appoitnment.

## 2013-03-26 NOTE — Progress Notes (Signed)
Quick Note:  Please notify pt that results are normal.   Provide pt with copy of labs. ______ 

## 2013-03-26 NOTE — Progress Notes (Signed)
S:  This 62 y.o. AA female is her to discuss treatment for HTN. She c/o fatigue which she attributes to Amlodipine. Mental fogginess seems to occur w/ this medication also. When she does not take this medication, she feels much better. Her BP on the med: SBP= 120-130; off medication SBP= 140. Pt is asymptomatic off medication; her home has stairs which she can climb w/o CP or tightness, palpitations, SOB or DOE. She denies HA, dizziness, numbness, weakness or syncope. She thinks BP was high in the past when she was working; she is now permanently retired. She has discontinued all medications and takes Alprazolam rarely for sleep. She admits that nutrition lacks adequate fruits and vegetables and she does tend to add salt to foods.  Patient Active Problem List   Diagnosis Date Noted  . HTN (hypertension) 12/19/2011  . Depression with anxiety 12/19/2011  . Hypercholesteremia 12/19/2011  . History of urinary tract infection 12/19/2011   PMHx, Surg Hx, Soc and Fam Hx reviewed.  Medications reconciled and discontinued.  ROS: As per HPI; negative for diaphoresis, abnormal weight gain or loss, vision disturbances, edema, apnea, orthopnea, myalgias/arthralgias, rashes or other skin or hair changes.   O: Filed Vitals:   03/26/13 0922  BP: 140/76  Pulse: 59  Temp: 98.2 F (36.8 C)  Resp: 16   GEN: In NAD; WN,WD. HENT: [place]/AT w/ clear conj/sclerae. EACs normal/nasal mucosa/ oroph clear and moist. NECK: Supple w/o LAN. COR: RRR. No edema. LUNGS: Normal resp rate and effort. SKIN: W&D; intact w/o diaphoresis, erythema or rashes. MS: MAEs; no deformities or effusions; no c/c/e.  NEURO: A&O x 3; CNs intact. Nonfocal.  A/P: HTN (hypertension) - Discontinue Amlodipine. Monitor BP at home 1-2 times daily.Plan: Basic metabolic panel  Other malaise and fatigue- Attributed to Amlodipine but may have been associated w/ Paroxetine also.   Medication side effect, initial encounter  Need for shingles  vaccine - Plan: zoster vaccine live, PF, (ZOSTAVAX) 16109 UNT/0.65ML injection

## 2013-04-20 ENCOUNTER — Ambulatory Visit (INDEPENDENT_AMBULATORY_CARE_PROVIDER_SITE_OTHER): Payer: BC Managed Care – PPO | Admitting: Physician Assistant

## 2013-04-20 VITALS — BP 140/86 | HR 76 | Temp 97.9°F | Resp 16 | Ht 64.5 in | Wt 125.4 lb

## 2013-04-20 DIAGNOSIS — J069 Acute upper respiratory infection, unspecified: Secondary | ICD-10-CM

## 2013-04-20 MED ORDER — AMOXICILLIN-POT CLAVULANATE 875-125 MG PO TABS: 1.0000 | ORAL_TABLET | Freq: Two times a day (BID) | ORAL | Status: DC

## 2013-04-20 MED ORDER — CETIRIZINE HCL 10 MG PO TABS: 10.0000 mg | ORAL_TABLET | Freq: Every day | ORAL | Status: DC

## 2013-04-20 MED ORDER — BENZONATATE 100 MG PO CAPS: 100.0000 mg | ORAL_CAPSULE | Freq: Three times a day (TID) | ORAL | Status: DC | PRN

## 2013-04-20 NOTE — Patient Instructions (Signed)
Begin using the cetirizine once daily - this will help dry up congestion  Use a nasal saline spray or neti pot - this will help keep your nose moisturized and hopefully less likely to bleed.  You can also use a TINY bit of vaseline just inside your nostril to keep the moisturized  Use the benzonatate every 8 hours if needed for cough  Drink plenty of fluids - water is best!  And get plenty of rest  If you decide to start the antibiotic, be sure to take the full course  Please let us know if any symptoms are worsening or not improving   Upper Respiratory Infection, Adult An upper respiratory infection (URI) is also sometimes known as the common cold. The upper respiratory tract includes the nose, sinuses, throat, trachea, and bronchi. Bronchi are the airways leading to the lungs. Most people improve within 1 week, but symptoms can last up to 2 weeks. A residual cough may last even longer.  CAUSES Many different viruses can infect the tissues lining the upper respiratory tract. The tissues become irritated and inflamed and often become very moist. Mucus production is also common. A cold is contagious. You can easily spread the virus to others by oral contact. This includes kissing, sharing a glass, coughing, or sneezing. Touching your mouth or nose and then touching a surface, which is then touched by another person, can also spread the virus. SYMPTOMS  Symptoms typically develop 1 to 3 days after you come in contact with a cold virus. Symptoms vary from person to person. They may include:  Runny nose.  Sneezing.  Nasal congestion.  Sinus irritation.  Sore throat.  Loss of voice (laryngitis).  Cough.  Fatigue.  Muscle aches.  Loss of appetite.  Headache.  Low-grade fever. DIAGNOSIS  You might diagnose your own cold based on familiar symptoms, since most people get a cold 2 to 3 times a year. Your caregiver can confirm this based on your exam. Most importantly, your caregiver  can check that your symptoms are not due to another disease such as strep throat, sinusitis, pneumonia, asthma, or epiglottitis. Blood tests, throat tests, and X-rays are not necessary to diagnose a common cold, but they may sometimes be helpful in excluding other more serious diseases. Your caregiver will decide if any further tests are required. RISKS AND COMPLICATIONS  You may be at risk for a more severe case of the common cold if you smoke cigarettes, have chronic heart disease (such as heart failure) or lung disease (such as asthma), or if you have a weakened immune system. The very young and very old are also at risk for more serious infections. Bacterial sinusitis, middle ear infections, and bacterial pneumonia can complicate the common cold. The common cold can worsen asthma and chronic obstructive pulmonary disease (COPD). Sometimes, these complications can require emergency medical care and may be life-threatening. PREVENTION  The best way to protect against getting a cold is to practice good hygiene. Avoid oral or hand contact with people with cold symptoms. Wash your hands often if contact occurs. There is no clear evidence that vitamin C, vitamin E, echinacea, or exercise reduces the chance of developing a cold. However, it is always recommended to get plenty of rest and practice good nutrition. TREATMENT  Treatment is directed at relieving symptoms. There is no cure. Antibiotics are not effective, because the infection is caused by a virus, not by bacteria. Treatment may include:  Increased fluid intake. Sports drinks offer valuable  electrolytes, sugars, and fluids.  Breathing heated mist or steam (vaporizer or shower).  Eating chicken soup or other clear broths, and maintaining good nutrition.  Getting plenty of rest.  Using gargles or lozenges for comfort.  Controlling fevers with ibuprofen or acetaminophen as directed by your caregiver.  Increasing usage of your inhaler if you  have asthma. Zinc gel and zinc lozenges, taken in the first 24 hours of the common cold, can shorten the duration and lessen the severity of symptoms. Pain medicines may help with fever, muscle aches, and throat pain. A variety of non-prescription medicines are available to treat congestion and runny nose. Your caregiver can make recommendations and may suggest nasal or lung inhalers for other symptoms.  HOME CARE INSTRUCTIONS   Only take over-the-counter or prescription medicines for pain, discomfort, or fever as directed by your caregiver.  Use a warm mist humidifier or inhale steam from a shower to increase air moisture. This may keep secretions moist and make it easier to breathe.  Drink enough water and fluids to keep your urine clear or pale yellow.  Rest as needed.  Return to work when your temperature has returned to normal or as your caregiver advises. You may need to stay home longer to avoid infecting others. You can also use a face mask and careful hand washing to prevent spread of the virus. SEEK MEDICAL CARE IF:   After the first few days, you feel you are getting worse rather than better.  You need your caregiver's advice about medicines to control symptoms.  You develop chills, worsening shortness of breath, or brown or red sputum. These may be signs of pneumonia.  You develop yellow or brown nasal discharge or pain in the face, especially when you bend forward. These may be signs of sinusitis.  You develop a fever, swollen neck glands, pain with swallowing, or white areas in the back of your throat. These may be signs of strep throat. SEEK IMMEDIATE MEDICAL CARE IF:   You have a fever.  You develop severe or persistent headache, ear pain, sinus pain, or chest pain.  You develop wheezing, a prolonged cough, cough up blood, or have a change in your usual mucus (if you have chronic lung disease).  You develop sore muscles or a stiff neck. Document Released: 09/19/2000  Document Revised: 06/18/2011 Document Reviewed: 07/28/2010 [facility] Patient Information 2014 [place], [place].

## 2013-04-20 NOTE — Progress Notes (Signed)
Subjective:    Patient ID: Donette LarryGwendolyn Karow, female    DOB: 07/22/1950, 63 y.o.   MRN: [id number]  HPI   Ms. Katrinka BlazingSmith is a very pleasant 63 yr old female here with concern for illness.  Reports 1 wk of cough, nasal congestion, nasal drainage, tickle in throat.  Started an allergy/cold pill which helped but unfortunately caused her nose to bleed.  Nosebleed stopped within 10 minutes with direct pressure.  Symptoms were improving - 3 days ago she felt really good.  Bus symptoms have now worsened.  She now has congestion, nasal drainage, cough again.  Endorses some frontal headache, mostly left side.  Some sore throat on the left side as well.  No fever.  Pt very concerned because she rarely gets sick.  Thinks perhaps due to taking the flu shot this season.   Review of Systems  Constitutional: Negative for fever and chills.  HENT: Positive for congestion, nosebleeds, rhinorrhea and sore throat. Negative for ear pain and sinus pressure.   Respiratory: Positive for cough. Negative for shortness of breath and wheezing.   Cardiovascular: Negative.   Gastrointestinal: Negative.   Musculoskeletal: Negative.   Skin: Negative.   Neurological: Positive for headaches.       Objective:   Physical Exam  Vitals reviewed. Constitutional: She is oriented to person, place, and time. She appears well-developed and well-nourished. No distress.  HENT:  Head: Normocephalic and atraumatic.  Right Ear: Tympanic membrane and ear canal normal.  Left Ear: Tympanic membrane and ear canal normal.  Nose: Mucosal edema and rhinorrhea present. Right sinus exhibits no maxillary sinus tenderness and no frontal sinus tenderness. Left sinus exhibits no maxillary sinus tenderness and no frontal sinus tenderness.  Mouth/Throat: Oropharynx is clear and moist and mucous membranes are normal.  Eyes: Conjunctivae are normal. No scleral icterus.  Neck: Neck supple.  Cardiovascular: Normal rate, regular rhythm and normal heart  sounds.   Pulmonary/Chest: Effort normal and breath sounds normal. She has no wheezes. She has no rales.  Lymphadenopathy:    She has no cervical adenopathy.  Neurological: She is alert and oriented to person, place, and time.  Skin: Skin is warm and dry.  Psychiatric: She has a normal mood and affect. Her behavior is normal.       Assessment & Plan:  URI (upper respiratory infection) - Plan: benzonatate (TESSALON) 100 MG capsule, cetirizine (ZYRTEC) 10 MG tablet, amoxicillin-clavulanate (AUGMENTIN) 875-125 MG per tablet   Ms. Katrinka BlazingSmith is a pleasant 63 yr old female here with URI symptoms x 1 wk, was improving but now worsening again.  Discussed with pt that I think this is likely viral and likely will resolve on it's own in the next several days.  However, with her double sickening and occ frontal headache, she may be developing a sinus infection.  Will treat symptoms with Zyrtec and Tessalon.  Pt prefers not to use nasal spray given recent nosebleeds.  Reassured her that the nosebleed is likely due to a combination of inflammation, irritation, dryness.  Can try nasal saline or neti pot to keep nasal mucosa moist.  I will go ahead and send Augmentin to cover sinusitis, but encouraged pt not to start this unless worsening or not improving in the next few days.  Pt would really like to start something today, but discussed with her that if this is due to a virus, abx will have no benefit.  Pt express understanding.  RTC if worsening or not improving  E. Lanora ManisElizabeth  Debbra Riding MHS, PA-C [facility] 1/13/20159:57 AM

## 2013-07-30 ENCOUNTER — Encounter: Payer: Self-pay | Admitting: Family Medicine

## 2013-07-30 ENCOUNTER — Ambulatory Visit (INDEPENDENT_AMBULATORY_CARE_PROVIDER_SITE_OTHER): Payer: BC Managed Care – PPO | Admitting: Family Medicine

## 2013-07-30 VITALS — BP 140/70 | HR 61 | Temp 98.1°F | Resp 12 | Ht 65.0 in | Wt 124.8 lb

## 2013-07-30 DIAGNOSIS — N951 Menopausal and female climacteric states: Secondary | ICD-10-CM

## 2013-07-30 NOTE — Patient Instructions (Addendum)
I recommend you try vaginal lubricant for vaginal dryness. You will need a GYN exam before prescription medication can be advised.  You can contact your GYN practice or return here for PAP/pelvic exam.

## 2013-07-30 NOTE — Progress Notes (Signed)
S:  This 63 y.o. AA female has HTN but recently discontinued medication due ti fatiue. BP readings at home= 120-140/60-90s. She feels much better off Amlodipine. She is no longer taking Alprazolam or Paroxetine.  Pt denies diaphoresis, vision disturbances, CP or palpitations, SOB or DOE, HA, dizziness, numbness or weakness.   Pt c/o vaginal dryness; has not tried any OTC products for lubrication. She does not have systemic menopause symptoms; she rarely experiences hot flushing or night sweats.  Patient Active Problem List   Diagnosis Date Noted  . HTN (hypertension) 12/19/2011  . Depression with anxiety 12/19/2011  . Hypercholesteremia 12/19/2011  . History of urinary tract infection 12/19/2011   PMHx, Surg Hx, Soc and Fam HX reviewed.   MEDICATIONS reconciled.  O: Filed Vitals:   07/30/13 0924  BP: 140/70  Pulse: 61  Temp: 98.1 F (36.7 C)  Resp: 12   GEN: In NAD: WN,WD. HENT: [place]/AT; EOMI w/ clear conj/sclerae. Otherwise unremarkable. COR: RRR. No edema. LUNGS: Normal resp rate and effort. SKIN: W&D; intact w/o erythema, diaphoresis or pallor. NEURO: A&O x 3; CNs intact. Nonfocal.  A/P: Vaginal dryness, menopausal- Print info given; advised pt to try OTC vainal lubricants first. If not effective, consider scheduling appt w/ GYN for expert advise about best prescription product for her individual situation.

## 2014-01-07 ENCOUNTER — Encounter: Payer: Self-pay | Admitting: Gastroenterology

## 2014-03-01 LAB — HM MAMMOGRAPHY

## 2014-03-11 ENCOUNTER — Encounter: Payer: Self-pay | Admitting: *Deleted

## 2014-04-16 ENCOUNTER — Ambulatory Visit (INDEPENDENT_AMBULATORY_CARE_PROVIDER_SITE_OTHER): Payer: BC Managed Care – PPO | Admitting: Family Medicine

## 2014-04-16 ENCOUNTER — Encounter: Payer: Self-pay | Admitting: Family Medicine

## 2014-04-16 VITALS — BP 138/78 | HR 66 | Temp 98.1°F | Resp 16 | Ht 65.0 in | Wt 120.0 lb

## 2014-04-16 DIAGNOSIS — F418 Other specified anxiety disorders: Secondary | ICD-10-CM

## 2014-04-16 DIAGNOSIS — E7800 Pure hypercholesterolemia, unspecified: Secondary | ICD-10-CM

## 2014-04-16 DIAGNOSIS — Z0000 Encounter for general adult medical examination without abnormal findings: Secondary | ICD-10-CM

## 2014-04-16 DIAGNOSIS — Z1159 Encounter for screening for other viral diseases: Secondary | ICD-10-CM

## 2014-04-16 LAB — POCT UA - MICROSCOPIC ONLY
Casts, Ur, LPF, POC: NEGATIVE
Crystals, Ur, HPF, POC: NEGATIVE
Epithelial cells, urine per micros: NEGATIVE
Mucus, UA: NEGATIVE
Yeast, UA: NEGATIVE

## 2014-04-16 LAB — POCT URINALYSIS DIPSTICK
Bilirubin, UA: NEGATIVE
Glucose, UA: NEGATIVE
Ketones, UA: NEGATIVE
Leukocytes, UA: NEGATIVE
Nitrite, UA: NEGATIVE
Protein, UA: NEGATIVE
Spec Grav, UA: 1.015
UROBILINOGEN UA: 0.2
pH, UA: 5.5

## 2014-04-16 LAB — COMPLETE METABOLIC PANEL WITH GFR
ALBUMIN: 4.4 g/dL (ref 3.5–5.2)
ALT: 19 U/L (ref 0–35)
AST: 23 U/L (ref 0–37)
Alkaline Phosphatase: 46 U/L (ref 39–117)
BUN: 15 mg/dL (ref 6–23)
CO2: 28 mEq/L (ref 19–32)
Calcium: 9.7 mg/dL (ref 8.4–10.5)
Chloride: 104 mEq/L (ref 96–112)
Creat: 0.66 mg/dL (ref 0.50–1.10)
GFR, Est African American: >89 mL/min
GFR, Est Non African American: >89 mL/min
GLUCOSE: 95 mg/dL (ref 70–99)
Potassium: 4.6 mEq/L (ref 3.5–5.3)
Sodium: 140 mEq/L (ref 135–145)
Total Bilirubin: 0.5 mg/dL (ref 0.2–1.2)
Total Protein: 7.5 g/dL (ref 6.0–8.3)

## 2014-04-16 LAB — CBC WITH DIFFERENTIAL/PLATELET
Basophils Absolute: 0 10*3/uL (ref 0.0–0.1)
Basophils Relative: 0 % (ref 0–1)
Eosinophils Absolute: 0 10*3/uL (ref 0.0–0.7)
Eosinophils Relative: 1 % (ref 0–5)
HCT: 38.9 % (ref 36.0–46.0)
Hemoglobin: 12.6 g/dL (ref 12.0–15.0)
Lymphocytes Relative: 39 % (ref 12–46)
Lymphs Abs: 1.4 10*3/uL (ref 0.7–4.0)
MCH: 28.3 pg (ref 26.0–34.0)
MCHC: 32.4 g/dL (ref 30.0–36.0)
MCV: 87.2 fL (ref 78.0–100.0)
MPV: 9.5 fL (ref 8.6–12.4)
Monocytes Absolute: 0.3 10*3/uL (ref 0.1–1.0)
Monocytes Relative: 9 % (ref 3–12)
NEUTROS ABS: 1.8 10*3/uL (ref 1.7–7.7)
Neutrophils Relative %: 51 % (ref 43–77)
Platelets: 263 10*3/uL (ref 150–400)
RBC: 4.46 MIL/uL (ref 3.87–5.11)
RDW: 13.9 % (ref 11.5–15.5)
WBC: 3.5 10*3/uL — AB (ref 4.0–10.5)

## 2014-04-16 LAB — LIPID PANEL
Cholesterol: 246 mg/dL — ABNORMAL HIGH (ref 0–200)
HDL: 83 mg/dL (ref >39)
LDL Cholesterol: 154 mg/dL — ABNORMAL HIGH (ref 0–99)
Total CHOL/HDL Ratio: 3 Ratio
Triglycerides: 47 mg/dL (ref <150)
VLDL: 9 mg/dL (ref 0–40)

## 2014-04-16 LAB — HEPATITIS C ANTIBODY: HCV AB: NEGATIVE

## 2014-04-16 NOTE — Patient Instructions (Addendum)
Keeping You Healthy  Get These Tests  Blood Pressure- Have your blood pressure checked by your healthcare provider at least once a year.  Normal blood pressure is 120/80.  Weight- Have your body mass index (BMI) calculated to screen for obesity.  BMI is a measure of body fat based on height and weight.  You can calculate your own BMI at [email or web address].com/bmi/  Cholesterol- Have your cholesterol checked every year.  Diabetes- Have your blood sugar checked every year if you have high blood pressure, high cholesterol, a family history of diabetes or if you are overweight.  Pap Smear- Have a pap smear every 1 to 3 years if you have been sexually active.  If you are older than 65 and recent pap smears have been normal you may not need additional pap smears.  In addition, if you have had a hysterectomy  For benign disease additional pap smears are not necessary.  Mammogram-Yearly mammograms are essential for early detection of breast cancer  Screening for Colon Cancer- Colonoscopy starting at age 50. Screening may begin sooner depending on your family history and other health conditions.  Follow up colonoscopy as directed by your Gastroenterologist.  Screening for Osteoporosis- Screening begins at age 65 with bone density scanning, sooner if you are at higher risk for developing Osteoporosis.  Get these medicines  Calcium with Vitamin D- Your body requires 1200-1500 mg of Calcium a day and 800-1000 IU of Vitamin D a day.  You can only absorb 500 mg of Calcium at a time therefore Calcium must be taken in 2 or 3 separate doses throughout the day.  Hormones- Hormone therapy has been associated with increased risk for certain cancers and heart disease.  Talk to your healthcare provider about if you need relief from menopausal symptoms.  Aspirin- Ask your healthcare provider about taking Aspirin to prevent Heart Disease and Stroke.  Get these Immuniztions  Flu shot- Every fall  Pneumonia  shot- Once after the age of 65; if you are younger ask your healthcare provider if you need a pneumonia shot.  Tetanus- Every ten years.  Zostavax- Once after the age of 60 to prevent shingles.  Take these steps  Don't smoke- Your healthcare provider can help you quit. For tips on how to quit, ask your healthcare provider or go to www.smokefree.gov or call 1-800 QUIT-NOW.  Be physically active- Exercise 5 days a week for a minimum of 30 minutes.  If you are not already physically active, start slow and gradually work up to 30 minutes of moderate physical activity.  Try walking, dancing, bike riding, swimming, etc.  Eat a healthy diet- Eat a variety of healthy foods such as fruits, vegetables, whole grains, low fat milk, low fat cheeses, yogurt, lean meats, chicken, fish, eggs, dried beans, tofu, etc.  For more information go to www.thenutritionsource.org  Dental visit- Brush and floss teeth twice daily; visit your dentist twice a year.  Eye exam- Visit your Optometrist or Ophthalmologist yearly.  Drink alcohol in moderation- Limit alcohol intake to one drink or less a day.  Never drink and drive.  Depression- Your emotional health is as important as your physical health.  If you're feeling down or losing interest in things you normally enjoy, please talk to your healthcare provider.  Seat Belts- can save your life; always wear one  Smoke/Carbon Monoxide detectors- These detectors need to be installed on the appropriate level of your home.  Replace batteries at least once a year.  Violence- If anyone   is threatening or hurting you, please tell your healthcare provider.  Living Will/ Health care power of attorney- Discuss with your healthcare provider and family.       Mediterranean Diet  Why follow it? Research shows. . Those who follow the Mediterranean diet have a reduced risk of heart disease  . The diet is associated with a reduced incidence of Parkinson's and Alzheimer's  diseases . People following the diet may have longer life expectancies and lower rates of chronic diseases  . The Dietary Guidelines for Americans recommends the Mediterranean diet as an eating plan to promote health and prevent disease  What Is the Mediterranean Diet?  . Healthy eating plan based on typical foods and recipes of Mediterranean-style cooking . The diet is primarily a plant based diet; these foods should make up a majority of meals   Starches - Plant based foods should make up a majority of meals - They are an important sources of vitamins, minerals, energy, antioxidants, and fiber - Choose whole grains, foods high in fiber and minimally processed items  - Typical grain sources include wheat, oats, barley, corn, brown rice, bulgar, farro, millet, polenta, couscous  - Various types of beans include chickpeas, lentils, fava beans, black beans, white beans   Fruits  Veggies - Large quantities of antioxidant rich fruits & veggies; 6 or more servings  - Vegetables can be eaten raw or lightly drizzled with oil and cooked  - Vegetables common to the traditional Mediterranean Diet include: artichokes, arugula, beets, broccoli, brussel sprouts, cabbage, carrots, celery, collard greens, cucumbers, eggplant, kale, leeks, lemons, lettuce, mushrooms, okra, onions, peas, peppers, potatoes, pumpkin, radishes, rutabaga, shallots, spinach, sweet potatoes, turnips, zucchini - Fruits common to the Mediterranean Diet include: apples, apricots, avocados, cherries, clementines, dates, figs, grapefruits, grapes, melons, nectarines, oranges, peaches, pears, pomegranates, strawberries, tangerines  Fats - Replace butter and margarine with healthy oils, such as olive oil, canola oil, and tahini  - Limit nuts to no more than a handful a day  - Nuts include walnuts, almonds, pecans, pistachios, pine nuts  - Limit or avoid candied, honey roasted or heavily salted nuts - Olives are central to the [employer] - can be eaten whole or used in a variety of dishes   Meats Protein - Limiting red meat: no more than a few times a month - When eating red meat: choose lean cuts and keep the portion to the size of deck of cards - Eggs: approx. 0 to 4 times a week  - Fish and lean poultry: at least 2 a week  - Healthy protein sources include, chicken, [place], lean beef, lamb - Increase intake of seafood such as tuna, salmon, trout, mackerel, shrimp, scallops - Avoid or limit high fat processed meats such as sausage and bacon  Dairy - Include moderate amounts of low fat dairy products  - Focus on healthy dairy such as fat free yogurt, skim milk, low or reduced fat cheese - Limit dairy products higher in fat such as whole or 2% milk, cheese, ice cream  Alcohol - Moderate amounts of red wine is ok  - No more than 5 oz daily for women (all ages) and men older than age 25  - No more than 10 oz of wine daily for men younger than 34  Other - Limit sweets and other desserts  - Use herbs and spices instead of salt to flavor foods  - Herbs and spices common to the traditional Mediterranean Diet include: basil,  bay leaves, chives, cloves, cumin, fennel, garlic, lavender, marjoram, mint, oregano, parsley, pepper, rosemary, sage, savory, sumac, tarragon, thyme   It's not just a diet, it's a lifestyle:  . The Mediterranean diet includes lifestyle factors typical of those in the region  . Foods, drinks and meals are best eaten with others and savored . Daily physical activity is important for overall good health . This could be strenuous exercise like running and aerobics . This could also be more leisurely activities such as walking, housework, yard-work, or taking the stairs . Moderation is the key; a balanced and healthy diet accommodates most foods and drinks . Consider portion sizes and frequency of consumption of certain foods   Meal Ideas & Options:  . Breakfast:  o Whole wheat toast or whole wheat English  muffins with peanut butter & hard boiled egg o Steel cut oats topped with apples & cinnamon and skim milk  o Fresh fruit: banana, strawberries, melon, berries, peaches  o Smoothies: strawberries, bananas, greek yogurt, peanut butter o Low fat greek yogurt with blueberries and granola  o Egg white omelet with spinach and mushrooms o Breakfast couscous: whole wheat couscous, apricots, skim milk, cranberries  . Sandwiches:  o Hummus and grilled vegetables (peppers, zucchini, squash) on whole wheat bread   o Grilled chicken on whole wheat pita with lettuce, tomatoes, cucumbers or tzatziki  o Tuna salad on whole wheat bread: tuna salad made with greek yogurt, olives, red peppers, capers, green onions o Garlic rosemary lamb pita: lamb sauted with garlic, rosemary, salt & pepper; add lettuce, cucumber, greek yogurt to pita - flavor with lemon juice and black pepper  . Seafood:  o Mediterranean grilled salmon, seasoned with garlic, basil, parsley, lemon juice and black pepper o Shrimp, lemon, and spinach whole-grain pasta salad made with low fat greek yogurt  o Seared scallops with lemon orzo  o Seared tuna steaks seasoned salt, pepper, coriander topped with tomato mixture of olives, tomatoes, olive oil, minced garlic, parsley, green onions and cappers  . Meats:  o Herbed greek chicken salad with kalamata olives, cucumber, feta  o Red bell peppers stuffed with spinach, bulgur, lean ground beef (or lentils) & topped with feta   o Kebabs: skewers of chicken, tomatoes, onions, zucchini, squash  o [place] burgers: made with red onions, mint, dill, lemon juice, feta cheese topped with roasted red peppers . Vegetarian o Cucumber salad: cucumbers, artichoke hearts, celery, red onion, feta cheese, tossed in olive oil & lemon juice  o Hummus and whole grain pita points with a greek salad (lettuce, tomato, feta, olives, cucumbers, red onion) o Lentil soup with celery, carrots made with vegetable broth,  garlic, salt and pepper  o Tabouli salad: parsley, bulgur, mint, scallions, cucumbers, tomato, radishes, lemon juice, olive oil, salt and pepper.    Pneumococcal Conjugate Vaccine: What You Need to Know Your doctor recommends that you, or your child, get a dose of PCV13 today. 1. Why get vaccinated? Pneumococcal conjugate vaccine (called PCV13 or Prevnar 13) is recommended to protect infants and toddlers, and some older children and adults with certain health conditions, from pneumococcal disease. Pneumococcal disease is caused by infection with Streptococcus pneumoniae bacteria. These bacteria can spread from person to person through close contact. Pneumococcal disease can lead to severe health problems, including pneumonia, blood infections, and meningitis. Meningitis is an infection of the covering of the brain. Pneumococcal meningitis is fairly rare (less than 1 case per 100,000 people each year), but it leads  to other health problems, including deafness and brain damage. In children, it is fatal in about 1 case out of 10. Children younger than two are at higher risk for serious disease than older children. People with certain medical conditions, people over age 73, and cigarette smokers are also at higher risk. Before vaccine, pneumococcal infections caused many problems each year in the [place] in children younger than 5, including:  more than 700 cases of meningitis,  13,000 blood infections,  about 5 million ear infections, and  about 200 deaths. About 4,000 adults still die each year because of pneumococcal infections. Pneumococcal infections can be hard to treat because some strains are resistant to antibiotics. This makes prevention through vaccination even more important. 2. PCV13 vaccine There are more than 90 types of pneumococcal bacteria. PCV13 protects against 13 of them. These 13 strains cause most severe infections in children and about half of infections in adults.   PCV13 is routinely given to children at 2, 4, 6, and 76-30 months of age. Children in this age range are at greatest risk for serious diseases caused by pneumococcal infection. PCV13 vaccine may also be recommended for some older children or adults. Your doctor can give you details. A second type of pneumococcal vaccine, called PPSV23, may also be given to some children and adults, including anyone over age 38. There is a separate Vaccine Information Statement for this vaccine. 3. Precautions  Anyone who has ever had a life-threatening allergic reaction to a dose of this vaccine, to an earlier pneumococcal vaccine called PCV7 (or Prevnar), or to any vaccine containing diphtheria toxoid (for example, DTaP), should not get PCV13. Anyone with a severe allergy to any component of PCV13 should not get the vaccine. Tell your doctor if the person being vaccinated has any severe allergies. If the person scheduled for vaccination is sick, your doctor might decide to reschedule the shot on another day. Your doctor can give you more information about any of these precautions. 4. What are the risks of PCV13 vaccine?  With any medicine, including vaccines, there is a chance of side effects. These are usually mild and go away on their own, but serious reactions are also possible. Reported problems associated with PCV13 vary by dose and age, but generally:  About half of children became drowsy after the shot, had a temporary loss of appetite, or had redness or tenderness where the shot was given.  About 1 out of 3 had swelling where the shot was given.  About 1 out of 3 had a mild fever, and about 1 in 20 had a higher fever (over 102.78F).  Up to about 8 out of 10 became fussy or irritable. Adults receiving the vaccine have reported redness, pain, and swelling where the shot was given. Mild fever, fatigue, headache, chills, or muscle pain have also been reported. Life-threatening allergic reactions from any  vaccine are very rare. 5. What if there is a serious reaction? What should I look for?  Look for anything that concerns you, such as signs of a severe allergic reaction, very high fever, or behavior changes. Signs of a severe allergic reaction can include hives, swelling of the face and throat, difficulty breathing, a fast heartbeat, dizziness, and weakness. These would start a few minutes to a few hours after the vaccination. What should I do?  If you think it is a severe allergic reaction or other emergency that can't wait, call 9-1-1 or get the person to the nearest hospital.  Otherwise, call your doctor.  Afterward, the reaction should be reported to the Vaccine Adverse Event Reporting System (VAERS). Your doctor might file this report, or you can do it yourself through the VAERS web site at www.vaers.LAgents.nohhs.gov, or by calling 1-(484)130-4665. VAERS is only for reporting reactions. They do not give medical advice. 6. The National Vaccine Injury Compensation Program The [employer] Vaccine Injury Compensation Program (VICP) is a federal program that was created to compensate people who may have been injured by certain vaccines. Persons who believe they may have been injured by a vaccine can learn about the program and about filing a claim by calling 1-256-167-0869 or visiting the VICP website at SpiritualWord.atwww.hrsa.gov/vaccinecompensation. 7. How can I learn more?  Ask your doctor.  Call your local or state health department.  Contact the Centers for Disease Control and Prevention (CDC):  Call 630-650-35731-575-270-3785 (1-800-CDC-INFO) or  Visit CDC's website at PicCapture.uywww.cdc.gov/vaccines CDC PCV13 Vaccine VIS (Interim) (06/06/11) Document Released: 01/21/2006 Document Revised: 08/10/2013 Document Reviewed: 05/15/2013 [facility] Patient Information 2015 [place], [place]. This information is not intended to replace advice given to you by your health care provider. Make sure you discuss any questions you have with your health  care provider.    Please contact your GYN specialist to schedule your appointment; it would be a good idea to discuss treatment for atrophic vaginitis and post menopausal changes.  If you need to return to discuss any other concerns, do not hesitate to contact our clinic.

## 2014-04-16 NOTE — Progress Notes (Signed)
Subjective:    Patient ID: Margaret Murphy, female    DOB: 11-25-1950, 64 y.o.   MRN: [id number]  HPI  This 64 y.o. Female is here for annual CPE. She plans to schedule GYN evaluation w/ specialist (previously seen by Dr. Henderson Cloud but pt has heard that he is retiring).She feels well in general but still having persistent vaginal dryness.  HCM: PAP- As above.           MMG- Current (Nov 2015- negative).           IMM- Declines Flu vaccine this winter.           CRS- Current (2012 w/ 10-year recall).           Vision- Every 1-2 years.           Dental- Every 6 months.  Patient Active Problem List   Diagnosis Date Noted  . HTN (hypertension) 12/19/2011  . Depression with anxiety 12/19/2011  . Hypercholesteremia 12/19/2011  . History of urinary tract infection 12/19/2011    Prior to Admission medications   Medication Sig Start Date End Date Taking? Authorizing Provider  ciclopirox (LOPROX) 0.77 % cream Apply topically as needed. 03/26/13  Yes Maurice March, MD  Dermatological Products, Misc. (NUVAIL EX) Apply topically.   Yes Historical Provider, MD  Efinaconazole (JUBLIA) 10 % SOLN Apply topically.   Yes Historical Provider, MD    History   Social History  . Marital Status: Divorced    Spouse Name: N/A    Number of Children: N/A  . Years of Education: N/A   Occupational History  . Retired Programmer, systems.   Social History Main Topics  . Smoking status: Never Smoker   . Smokeless tobacco: Not on file  . Alcohol Use: No  . Drug Use: No  . Sexual Activity: Yes   Other Topics Concern  . Aging parents live in the [place] part of the state; she and siblings manage caregiving as needed.   Social History Narrative   Married. Education: College/other.    Family History  Problem Relation Age of Onset  . Hypertension Mother     dx in 75's- not sure of age  . Hypertension Maternal Grandmother     does not know age of dx  . Glaucoma Father   . Hypertension Sister   .  Diabetes Brother   . Cancer Maternal Aunt 78    Breast cancer    Review of Systems  Constitutional: Negative.   HENT: Negative.   Eyes: Negative.   Respiratory: Negative.   Cardiovascular: Negative.   Gastrointestinal: Negative.   Endocrine: Negative.   Genitourinary: Negative.   Musculoskeletal: Positive for arthralgias. Negative for myalgias, back pain, joint swelling and gait problem.       R elbow- intermittent discomfort, not daily. Knees- soreness but climbs stairs in home multiple times in one day.  Skin: Negative.   Neurological: Negative.   Psychiatric/Behavioral: Negative.        "I have some issues that I am dealing with"- did not want to discuss. Has concerns about aging parents.      Objective:   Physical Exam  Constitutional: She is oriented to person, place, and time. Vital signs are normal. She appears well-developed and well-nourished. No distress.  HENT:  Head: Normocephalic and atraumatic.  Right Ear: Hearing, tympanic membrane, external ear and ear canal normal.  Left Ear: Hearing, tympanic membrane, external ear and ear canal normal.  Nose: Nose normal. No nasal deformity  or septal deviation.  Mouth/Throat: Uvula is midline, oropharynx is clear and moist and mucous membranes are normal. No oral lesions. Normal dentition. No dental caries.  Eyes: Conjunctivae, EOM and lids are normal. Pupils are equal, round, and reactive to light. No scleral icterus.  Fundoscopic exam:      The right eye shows no papilledema. The right eye shows red reflex.       The left eye shows no papilledema. The left eye shows red reflex.  Neck: Trachea normal, normal range of motion, full passive range of motion without pain and phonation normal. Neck supple. No JVD present. No spinous process tenderness and no muscular tenderness present. Carotid bruit is not present. No thyroid mass and no thyromegaly present.  Cardiovascular: Normal rate, regular rhythm, S1 normal, normal heart  sounds, intact distal pulses and normal pulses.   No extrasystoles are present. PMI is not displaced.  Exam reveals no gallop and no friction rub.   No murmur heard. Pulmonary/Chest: Effort normal and breath sounds normal. She has no decreased breath sounds. Right breast exhibits no inverted nipple, no mass, no nipple discharge, no skin change and no tenderness. Left breast exhibits no inverted nipple, no mass, no nipple discharge, no skin change and no tenderness. Breasts are symmetrical.  Abdominal: Soft. Normal appearance and bowel sounds are normal. She exhibits no distension, no abdominal bruit, no pulsatile midline mass and no mass. There is no hepatosplenomegaly. There is no tenderness. There is no guarding and no CVA tenderness.  Genitourinary:  Deferred.  Musculoskeletal:       Right shoulder: Normal.       Left shoulder: Normal.       Right elbow: Normal.      Left elbow: Normal.       Right wrist: Normal.       Left wrist: Normal.       Right knee: Normal.       Left knee: Normal.       Cervical back: Normal.       Thoracic back: Normal.       Lumbar back: Normal.       Right hand: Normal.       Left hand: Normal.  Remainder of exam unremarkable.  Lymphadenopathy:       Head (right side): No submental, no submandibular, no tonsillar, no preauricular, no posterior auricular and no occipital adenopathy present.       Head (left side): No submental, no submandibular, no tonsillar, no preauricular, no posterior auricular and no occipital adenopathy present.    She has no cervical adenopathy.    She has no axillary adenopathy.       Right: No inguinal and no supraclavicular adenopathy present.       Left: No inguinal and no supraclavicular adenopathy present.  Neurological: She is alert and oriented to person, place, and time. She has normal strength and normal reflexes. She displays no atrophy. No cranial nerve deficit or sensory deficit. She exhibits normal muscle tone. She  displays a negative Romberg sign. Coordination and gait normal.  Skin: Skin is warm, dry and intact. No ecchymosis, no lesion and no rash noted. She is not diaphoretic. No cyanosis or erythema. No pallor. Nails show no clubbing.  Psychiatric: Her speech is normal and behavior is normal. Judgment and thought content normal. Her mood appears not anxious. Her affect is not labile and not inappropriate. Cognition and memory are normal.  Flat affect. PHQ-9 score= 1.  Nursing note  and vitals reviewed.   Results for orders placed or performed in visit on 04/16/14  POCT urinalysis dipstick  Result Value Ref Range   Color, UA yellow]    Clarity, UA clear    Glucose, UA neg    Bilirubin, UA neg    Ketones, UA neg    Spec Grav, UA 1.015    Blood, UA trace    pH, UA 5.5    Protein, UA neg    Urobilinogen, UA 0.2    Nitrite, UA neg    Leukocytes, UA Negative   POCT UA - Microscopic Only  Result Value Ref Range   WBC, Ur, HPF, POC 0-2    RBC, urine, microscopic 0-1    Bacteria, U Microscopic trace    Mucus, UA neg    Epithelial cells, urine per micros neg    Crystals, Ur, HPF, POC neg    Casts, Ur, LPF, POC neg    Yeast, UA neg        Assessment & Plan:  Routine general medical examination at a health care facility - Plan: POCT urinalysis dipstick, POCT UA - Microscopic Only, CBC with Differential, COMPLETE METABOLIC PANEL WITH GFR, Lipid panel, Hepatitis C Antibody, IFOBT POC (occult bld, rslt in office)  Hypercholesteremia - Plan: Lipid panel  Depression with anxiety- Pt opts not to take prescription medication.  Need for hepatitis C screening test - Plan: Hepatitis C Antibody

## 2014-04-20 NOTE — Progress Notes (Signed)
Quick Note:  Please advise pt regarding following labs.. I have reviewed your recent labs. Blood cell counts are normal. Metabolic panel (salts, blood sugar, kidney and liver function) are all normal.  Total cholesterol is above normal as is LDL ("bad") cholesterol. Fortunately, HDL ("good") cholesterol is very high. Continue to eat healthy and stay active. Over-the-counter Fish Oil or Flax seed supplement can help lower the "bad" cholesterol numbers. Try taking 1000- 1200 mg daily.  Hepatitis C antibody test is negative.  Copy to pt. ______

## 2014-05-21 LAB — IFOBT (OCCULT BLOOD): IFOBT: NEGATIVE

## 2014-05-23 NOTE — Progress Notes (Signed)
Quick Note:  Please advise pt regarding following labs... Stool specimen is negative for blood. ______

## 2015-04-19 ENCOUNTER — Encounter (HOSPITAL_COMMUNITY): Payer: Self-pay | Admitting: Nurse Practitioner

## 2015-04-19 ENCOUNTER — Emergency Department (HOSPITAL_COMMUNITY)
Admission: EM | Admit: 2015-04-19 | Discharge: 2015-04-19 | Disposition: A | Discharge status: Home / Self Care | Payer: BC Managed Care – PPO | Attending: Emergency Medicine | Admitting: Emergency Medicine

## 2015-04-19 ENCOUNTER — Emergency Department (HOSPITAL_COMMUNITY): Payer: BC Managed Care – PPO

## 2015-04-19 DIAGNOSIS — S39012A Strain of muscle, fascia and tendon of lower back, initial encounter: Secondary | ICD-10-CM | POA: Diagnosis not present

## 2015-04-19 DIAGNOSIS — Y9289 Other specified places as the place of occurrence of the external cause: Secondary | ICD-10-CM | POA: Diagnosis not present

## 2015-04-19 DIAGNOSIS — R110 Nausea: Secondary | ICD-10-CM | POA: Diagnosis not present

## 2015-04-19 DIAGNOSIS — Y998 Other external cause status: Secondary | ICD-10-CM | POA: Insufficient documentation

## 2015-04-19 DIAGNOSIS — Y9389 Activity, other specified: Secondary | ICD-10-CM | POA: Insufficient documentation

## 2015-04-19 DIAGNOSIS — X500XXA Overexertion from strenuous movement or load, initial encounter: Secondary | ICD-10-CM | POA: Diagnosis not present

## 2015-04-19 DIAGNOSIS — I10 Essential (primary) hypertension: Secondary | ICD-10-CM | POA: Insufficient documentation

## 2015-04-19 DIAGNOSIS — Z8659 Personal history of other mental and behavioral disorders: Secondary | ICD-10-CM | POA: Diagnosis not present

## 2015-04-19 DIAGNOSIS — S3992XA Unspecified injury of lower back, initial encounter: Secondary | ICD-10-CM | POA: Diagnosis present

## 2015-04-19 MED ORDER — ONDANSETRON 8 MG PO TBDP: 8.0000 mg | ORAL_TABLET | Freq: Three times a day (TID) | ORAL | Status: DC | PRN

## 2015-04-19 MED ORDER — IBUPROFEN 200 MG PO TABS
400.0000 mg | ORAL_TABLET | Freq: Once | ORAL | Status: AC
  Administered 2015-04-19: 400 mg via ORAL
  Filled 2015-04-19: qty 2

## 2015-04-19 MED ORDER — ONDANSETRON 8 MG PO TBDP
8.0000 mg | ORAL_TABLET | Freq: Once | ORAL | Status: AC
  Administered 2015-04-19: 8 mg via ORAL
  Filled 2015-04-19: qty 1

## 2015-04-19 MED ORDER — TRAMADOL HCL 50 MG PO TABS: 50.0000 mg | ORAL_TABLET | Freq: Four times a day (QID) | ORAL | Status: DC | PRN

## 2015-04-19 MED ORDER — HYDROCODONE-ACETAMINOPHEN 5-325 MG PO TABS
1.0000 | ORAL_TABLET | Freq: Once | ORAL | Status: AC
  Administered 2015-04-19: 1 via ORAL
  Filled 2015-04-19: qty 1

## 2015-04-19 NOTE — Discharge Instructions (Signed)
It was our pleasure to provide your ER care today - we hope that you feel better.  Take motrin or aleve as need for pain.    You may also take ultram as need for pain.   You may take zofran as need for nausea (as sometimes, pain medication can cause nausea)  Avoid bending at waist or heavy lifting more than 20 lbs for the next week. Try heat/heating pad to sore area.   Follow up with primary care doctor in coming week for recheck if symptoms fail to improve/resolve.  Also follow up with your doctor for recheck of blood pressure as it is high today.   Return to ER if worse, new symptoms, fevers, numbness/weakness, other concern.  You were given pain medication in the ER - no driving for the next 4 hours.       Lumbosacral Strain Lumbosacral strain is a strain of any of the parts that make up your lumbosacral vertebrae. Your lumbosacral vertebrae are the bones that make up the lower third of your backbone. Your lumbosacral vertebrae are held together by muscles and tough, fibrous tissue (ligaments).  CAUSES  A sudden blow to your back can cause lumbosacral strain. Also, anything that causes an excessive stretch of the muscles in the low back can cause this strain. This is typically seen when people exert themselves strenuously, fall, lift heavy objects, bend, or crouch repeatedly. RISK FACTORS  Physically demanding work.  Participation in pushing or pulling sports or sports that require a sudden twist of the back (tennis, golf, baseball).  Weight lifting.  Excessive lower back curvature.  Forward-tilted pelvis.  Weak back or abdominal muscles or both.  Tight hamstrings. SIGNS AND SYMPTOMS  Lumbosacral strain may cause pain in the area of your injury or pain that moves (radiates) down your leg.  DIAGNOSIS Your health care provider can often diagnose lumbosacral strain through a physical exam. In some cases, you may need tests such as X-ray exams.  TREATMENT  Treatment for  your lower back injury depends on many factors that your clinician will have to evaluate. However, most treatment will include the use of anti-inflammatory medicines. HOME CARE INSTRUCTIONS   Avoid hard physical activities (tennis, racquetball, waterskiing) if you are not in proper physical condition for it. This may aggravate or create problems.  If you have a back problem, avoid sports requiring sudden body movements. Swimming and walking are generally safer activities.  Maintain good posture.  Maintain a healthy weight.  For acute conditions, you may put ice on the injured area.  Put ice in a plastic bag.  Place a towel between your skin and the bag.  Leave the ice on for 20 minutes, 2-3 times a day.  When the low back starts healing, stretching and strengthening exercises may be recommended. SEEK MEDICAL CARE IF:  Your back pain is getting worse.  You experience severe back pain not relieved with medicines. SEEK IMMEDIATE MEDICAL CARE IF:   You have numbness, tingling, weakness, or problems with the use of your arms or legs.  There is a change in bowel or bladder control.  You have increasing pain in any area of the body, including your belly (abdomen).  You notice shortness of breath, dizziness, or feel faint.  You feel sick to your stomach (nauseous), are throwing up (vomiting), or become sweaty.  You notice discoloration of your toes or legs, or your feet get very cold. MAKE SURE YOU:   Understand these instructions.  Will  watch your condition.  Will get help right away if you are not doing well or get worse.   This information is not intended to replace advice given to you by your health care provider. Make sure you discuss any questions you have with your health care provider.   Document Released: 01/03/2005 Document Revised: 04/16/2014 Document Reviewed: 11/12/2012 Elsevier Interactive Patient Education 2016 Elsevier Inc.  Foot LockerHeat Therapy Heat therapy can  help ease sore, stiff, injured, and tight muscles and joints. Heat relaxes your muscles, which may help ease your pain.  RISKS AND COMPLICATIONS If you have any of the following conditions, do not use heat therapy unless your health care provider has approved:  Poor circulation.  Healing wounds or scarred skin in the area being treated.  Diabetes, heart disease, or high blood pressure.  Not being able to feel (numbness) the area being treated.  Unusual swelling of the area being treated.  Active infections.  Blood clots.  Cancer.  Inability to communicate pain. This may include young children and people who have problems with their brain function (dementia).  Pregnancy. Heat therapy should only be used on old, pre-existing, or long-lasting (chronic) injuries. Do not use heat therapy on new injuries unless directed by your health care provider. HOW TO USE HEAT THERAPY There are several different kinds of heat therapy, including:  Moist heat pack.  Warm water bath.  Hot water bottle.  Electric heating pad.  Heated gel pack.  Heated wrap.  Electric heating pad. Use the heat therapy method suggested by your health care provider. Follow your health care provider's instructions on when and how to use heat therapy. GENERAL HEAT THERAPY RECOMMENDATIONS  Do not sleep while using heat therapy. Only use heat therapy while you are awake.  Your skin may turn pink while using heat therapy. Do not use heat therapy if your skin turns red.  Do not use heat therapy if you have new pain.  High heat or long exposure to heat can cause burns. Be careful when using heat therapy to avoid burning your skin.  Do not use heat therapy on areas of your skin that are already irritated, such as with a rash or sunburn. SEEK MEDICAL CARE IF:  You have blisters, redness, swelling, or numbness.  You have new pain.  Your pain is worse. MAKE SURE YOU:  Understand these instructions.  Will  watch your condition.  Will get help right away if you are not doing well or get worse.   This information is not intended to replace advice given to you by your health care provider. Make sure you discuss any questions you have with your health care provider.   Document Released: 06/18/2011 Document Revised: 04/16/2014 Document Reviewed: 05/19/2013 Elsevier Interactive Patient Education [employer]2016 Elsevier Inc.

## 2015-04-19 NOTE — ED Notes (Signed)
Patient presents to [facility] with complaints of back pain that began 3 days ago, in her lower back, radiating across her buttocks and wrapping around her front, describes the pain as constant and sharp. She has tried a heating pad, ibuprofen, aspirin, and hot shower without relief.

## 2015-04-19 NOTE — ED Provider Notes (Signed)
CSN: [id number]     Arrival date & time 04/19/15  0741 History   First MD Initiated Contact with Patient 04/19/15 0749     No chief complaint on file.    (Consider location/radiation/quality/duration/timing/severity/associated sxs/prior Treatment) The history is provided by the patient.  Patient c/o low back pain for the past few days. Had done some heavy lifting, water/boxes the day prior to onset pain. Pain localized to lower back. Constant, dull, worse w certain movements and position changes. No urinary retention or incontinence. No fevers. No radicular pain. No numbness/weakness.        Past Medical History  Diagnosis Date  . Anxiety   . Hypertension   . Depression    Past Surgical History  Procedure Laterality Date  . Cesarean section      #1 1978  and #2 1989   Family History  Problem Relation Age of Onset  . Hypertension Mother     dx in 70's- not sure of age  . Hypertension Maternal Grandmother     does not know age of dx  . Glaucoma Father   . Hypertension Sister   . Diabetes Brother   . Cancer Maternal Aunt 41    Breast cancer   Social History  Substance Use Topics  . Smoking status: Never Smoker   . Smokeless tobacco: Not on file  . Alcohol Use: No   OB History    No data available     Review of Systems  Constitutional: Negative for fever and chills.  HENT: Negative for sore throat.   Eyes: Negative for redness.  Respiratory: Negative for shortness of breath.   Cardiovascular: Negative for chest pain.  Gastrointestinal: Negative for vomiting and diarrhea.  Genitourinary: Negative for dysuria.  Musculoskeletal: Positive for back pain. Negative for neck pain.  Skin: Negative for rash.  Neurological: Negative for weakness, numbness and headaches.  Hematological: Does not bruise/bleed easily.  Psychiatric/Behavioral: Negative for confusion.      Allergies  Diuretic; Metoprolol; and Sulfonamide derivatives  Home Medications   Prior to  Admission medications   Medication Sig Start Date End Date Taking? Authorizing Provider  ciclopirox (LOPROX) 0.77 % cream Apply topically as needed. 03/26/13   Maurice March, MD  Dermatological Products, Misc. (NUVAIL EX) Apply topically.    Historical Provider, MD  Efinaconazole (JUBLIA) 10 % SOLN Apply topically.    Historical Provider, MD   There were no vitals taken for this visit. Physical Exam  Constitutional: She is oriented to person, place, and time. She appears well-developed and well-nourished. No distress.  HENT:  Mouth/Throat: Oropharynx is clear and moist.  Eyes: Conjunctivae are normal. No scleral icterus.  Neck: Neck supple. No tracheal deviation present.  Cardiovascular: Normal rate.   Pulmonary/Chest: Effort normal. No respiratory distress.  Abdominal: Soft. Normal appearance. She exhibits no distension and no mass. There is no tenderness. There is no rebound and no guarding.  Genitourinary:  No cva tenderness  Musculoskeletal: She exhibits no edema.  CTLS spine, non tender, aligned, no step off.   Neurological: She is alert and oriented to person, place, and time.  Steady gait  Skin: Skin is warm and dry. No rash noted. She is not diaphoretic.  Psychiatric: She has a normal mood and affect.  Nursing note and vitals reviewed.   ED Course  Procedures (including critical care time)  Dg Lumbar Spine Complete  04/19/2015  CLINICAL DATA:  Mid lower back pain since lifting a case of ball water four  days ago, no relief with over the counter medication. EXAM: LUMBAR SPINE - COMPLETE 4+ VIEW COMPARISON:  None in PACs FINDINGS: The lumbar vertebral bodies are preserved in height. There is mild disc space narrowing at L4-5. The pedicles and transverse processes are intact. There is no spondylolisthesis. IMPRESSION: There is mild disc space narrowing at L4-5. There is no acute bony abnormality. Electronically Signed   By: David  SwazilandJordan M.D.   On: 04/19/2015 09:07       I have personally reviewed and evaluated these images and lab results as part of my medical decision-making.    MDM   Reviewed nursing notes and prior charts for additional history.   Pt has ride, does not have to drive.  Motrin po.  vicodin 1 po.  Recheck, pain improved, +nausea. zofran po.  Pt feels improved.  Pt currently appears stable for d/c.    Cathren LaineKevin Dejohn Ibarra, MD 04/19/15 1003

## 2015-05-04 LAB — HM MAMMOGRAPHY

## 2015-05-06 ENCOUNTER — Encounter: Payer: Self-pay | Admitting: Family Medicine

## 2015-05-11 ENCOUNTER — Encounter: Payer: Self-pay | Admitting: Family Medicine

## 2015-05-11 ENCOUNTER — Ambulatory Visit (INDEPENDENT_AMBULATORY_CARE_PROVIDER_SITE_OTHER): Payer: BC Managed Care – PPO | Admitting: Family Medicine

## 2015-05-11 VITALS — BP 129/67 | HR 73 | Temp 98.1°F | Resp 16 | Ht 65.0 in | Wt 121.2 lb

## 2015-05-11 DIAGNOSIS — Z1329 Encounter for screening for other suspected endocrine disorder: Secondary | ICD-10-CM

## 2015-05-11 DIAGNOSIS — Z23 Encounter for immunization: Secondary | ICD-10-CM

## 2015-05-11 DIAGNOSIS — S161XXA Strain of muscle, fascia and tendon at neck level, initial encounter: Secondary | ICD-10-CM | POA: Diagnosis not present

## 2015-05-11 DIAGNOSIS — Z13220 Encounter for screening for lipoid disorders: Secondary | ICD-10-CM | POA: Diagnosis not present

## 2015-05-11 DIAGNOSIS — K589 Irritable bowel syndrome without diarrhea: Secondary | ICD-10-CM | POA: Diagnosis not present

## 2015-05-11 DIAGNOSIS — Z130 Encounter for screening for diseases of the blood and blood-forming organs and certain disorders involving the immune mechanism: Secondary | ICD-10-CM

## 2015-05-11 DIAGNOSIS — Z131 Encounter for screening for diabetes mellitus: Secondary | ICD-10-CM | POA: Diagnosis not present

## 2015-05-11 DIAGNOSIS — Z0000 Encounter for general adult medical examination without abnormal findings: Secondary | ICD-10-CM | POA: Diagnosis not present

## 2015-05-11 LAB — CBC
HCT: 37.3 % (ref 36.0–46.0)
Hemoglobin: 11.9 g/dL — ABNORMAL LOW (ref 12.0–15.0)
MCH: 28.4 pg (ref 26.0–34.0)
MCHC: 31.9 g/dL (ref 30.0–36.0)
MCV: 89 fL (ref 78.0–100.0)
MPV: 9.9 fL (ref 8.6–12.4)
PLATELETS: 258 10*3/uL (ref 150–400)
RBC: 4.19 MIL/uL (ref 3.87–5.11)
RDW: 14.1 % (ref 11.5–15.5)
WBC: 4.1 10*3/uL (ref 4.0–10.5)

## 2015-05-11 LAB — COMPREHENSIVE METABOLIC PANEL
ALBUMIN: 4.5 g/dL (ref 3.6–5.1)
ALT: 15 U/L (ref 6–29)
AST: 19 U/L (ref 10–35)
Alkaline Phosphatase: 44 U/L (ref 33–130)
BILIRUBIN TOTAL: 0.5 mg/dL (ref 0.2–1.2)
BUN: 19 mg/dL (ref 7–25)
CHLORIDE: 105 mmol/L (ref 98–110)
CO2: 29 mmol/L (ref 20–31)
CREATININE: 0.8 mg/dL (ref 0.50–0.99)
Calcium: 9.8 mg/dL (ref 8.6–10.4)
Glucose, Bld: 84 mg/dL (ref 65–99)
Potassium: 3.7 mmol/L (ref 3.5–5.3)
SODIUM: 141 mmol/L (ref 135–146)
TOTAL PROTEIN: 7.4 g/dL (ref 6.1–8.1)

## 2015-05-11 LAB — LIPID PANEL
CHOLESTEROL: 233 mg/dL — AB (ref 125–200)
HDL: 93 mg/dL (ref >=46)
LDL CALC: 128 mg/dL (ref <130)
Total CHOL/HDL Ratio: 2.5 Ratio (ref <=5.0)
Triglycerides: 59 mg/dL (ref <150)
VLDL: 12 mg/dL (ref <30)

## 2015-05-11 LAB — TSH: TSH: 0.935 u[IU]/mL (ref 0.350–4.500)

## 2015-05-11 LAB — HEMOGLOBIN A1C
HEMOGLOBIN A1C: 6 % — AB (ref <5.7)
MEAN PLASMA GLUCOSE: 126 mg/dL — AB (ref <117)

## 2015-05-11 MED ORDER — HYOSCYAMINE SULFATE 0.125 MG PO TABS: 0.1250 mg | ORAL_TABLET | ORAL | Status: DC | PRN

## 2015-05-11 MED ORDER — METHOCARBAMOL 500 MG PO TABS: 500.0000 mg | ORAL_TABLET | Freq: Three times a day (TID) | ORAL | Status: DC | PRN
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
## 2015-05-11 NOTE — Patient Instructions (Addendum)
It would be my pleasure to continue to see you as a patient at my new office, starting the last week of February.  If you prefer to remain at [facility] one of my partners will be happy to see you here.   [facility] [address], [place], [place] [address] Phone: [phone]-  I will be in touch with your labs asap  Continue to use the hyoscasmine as needed for your IBS You can also use the robaxin as needed for muscle pains  Take care!

## 2015-05-12 ENCOUNTER — Encounter: Payer: Self-pay | Admitting: Family Medicine

## 2015-05-12 DIAGNOSIS — R7303 Prediabetes: Secondary | ICD-10-CM | POA: Insufficient documentation

## 2015-05-27 ENCOUNTER — Telehealth: Payer: Self-pay

## 2015-05-27 NOTE — Telephone Encounter (Signed)
Pt would like cb for lab results// she states she has received 2 msgs from [name].   [phone] VM OKAY

## 2015-05-29 NOTE — Telephone Encounter (Signed)
Pt notified of results and cologard

## 2015-09-14 ENCOUNTER — Encounter: Payer: Self-pay | Admitting: Family Medicine

## 2015-09-29 LAB — COLOGUARD: COLOGUARD: NEGATIVE

## 2016-01-06 ENCOUNTER — Telehealth: Payer: Self-pay | Admitting: *Deleted

## 2016-01-06 NOTE — Telephone Encounter (Signed)
Patient notified of results.

## 2016-05-16 ENCOUNTER — Telehealth: Payer: Self-pay | Admitting: General Practice

## 2016-05-16 NOTE — Telephone Encounter (Signed)
Relation to AV:WUJWpt:self Call back [phone]  Reason for call:  Patient schedule new patient appointment with Dr. Patsy Lageropland 07/04/16 at noon and stated she would like a physical conducted at the time of her appointment,please advise

## 2016-05-17 NOTE — Telephone Encounter (Signed)
As long as the pt does not have many other concerns that day a physical will be fine. If she does have several concerns she will need two separate appointments.

## 2016-05-18 NOTE — Telephone Encounter (Signed)
lvm advising patient to call back regarding message below

## 2016-06-26 ENCOUNTER — Encounter: Payer: Self-pay | Admitting: Family Medicine

## 2016-07-04 ENCOUNTER — Ambulatory Visit (INDEPENDENT_AMBULATORY_CARE_PROVIDER_SITE_OTHER): Payer: Medicare Other | Admitting: Family Medicine

## 2016-07-04 ENCOUNTER — Encounter: Payer: Self-pay | Admitting: Family Medicine

## 2016-07-04 VITALS — BP 132/80 | HR 68 | Temp 98.0°F | Ht 65.0 in | Wt 121.0 lb

## 2016-07-04 DIAGNOSIS — Z0000 Encounter for general adult medical examination without abnormal findings: Secondary | ICD-10-CM

## 2016-07-04 DIAGNOSIS — K589 Irritable bowel syndrome without diarrhea: Secondary | ICD-10-CM | POA: Diagnosis not present

## 2016-07-04 DIAGNOSIS — Z130 Encounter for screening for diseases of the blood and blood-forming organs and certain disorders involving the immune mechanism: Secondary | ICD-10-CM

## 2016-07-04 DIAGNOSIS — E785 Hyperlipidemia, unspecified: Secondary | ICD-10-CM | POA: Diagnosis not present

## 2016-07-04 DIAGNOSIS — R7309 Other abnormal glucose: Secondary | ICD-10-CM | POA: Diagnosis not present

## 2016-07-04 DIAGNOSIS — E2839 Other primary ovarian failure: Secondary | ICD-10-CM | POA: Diagnosis not present

## 2016-07-04 LAB — LIPID PANEL
CHOLESTEROL: 223 mg/dL — AB (ref 0–200)
HDL: 85.4 mg/dL (ref >39.00)
LDL CALC: 126 mg/dL — AB (ref 0–99)
NonHDL: 138.09
TRIGLYCERIDES: 59 mg/dL (ref 0.0–149.0)
Total CHOL/HDL Ratio: 3
VLDL: 11.8 mg/dL (ref 0.0–40.0)

## 2016-07-04 LAB — COMPREHENSIVE METABOLIC PANEL
ALBUMIN: 4.5 g/dL (ref 3.5–5.2)
ALK PHOS: 37 U/L — AB (ref 39–117)
ALT: 13 U/L (ref 0–35)
AST: 20 U/L (ref 0–37)
BUN: 20 mg/dL (ref 6–23)
CALCIUM: 9.7 mg/dL (ref 8.4–10.5)
CHLORIDE: 106 meq/L (ref 96–112)
CO2: 30 mEq/L (ref 19–32)
Creatinine, Ser: 0.77 mg/dL (ref 0.40–1.20)
GFR: 96.64 mL/min (ref >60.00)
Glucose, Bld: 92 mg/dL (ref 70–99)
POTASSIUM: 3.8 meq/L (ref 3.5–5.1)
SODIUM: 141 meq/L (ref 135–145)
TOTAL PROTEIN: 7.4 g/dL (ref 6.0–8.3)
Total Bilirubin: 0.5 mg/dL (ref 0.2–1.2)

## 2016-07-04 LAB — CBC
HEMATOCRIT: 36.7 % (ref 36.0–46.0)
HEMOGLOBIN: 11.8 g/dL — AB (ref 12.0–15.0)
MCHC: 32.2 g/dL (ref 30.0–36.0)
MCV: 89.1 fl (ref 78.0–100.0)
Platelets: 247 10*3/uL (ref 150.0–400.0)
RBC: 4.13 Mil/uL (ref 3.87–5.11)
RDW: 14 % (ref 11.5–15.5)
WBC: 3.9 10*3/uL — ABNORMAL LOW (ref 4.0–10.5)

## 2016-07-04 LAB — HEMOGLOBIN A1C: Hgb A1c MFr Bld: 6.1 % (ref 4.6–6.5)

## 2016-07-04 MED ORDER — HYOSCYAMINE SULFATE 0.125 MG PO TABS: 0.1250 mg | ORAL_TABLET | ORAL | 2 refills | Status: DC | PRN

## 2016-07-04 NOTE — Patient Instructions (Addendum)
It was very nice to see you today!  I will be in touch with your labs asap and we will arrange a bone density test for you.  Please continue to keep an eye on your blood pressure- if you are consistently higher than approx 140/85 please let me know  Health Maintenance, Female Adopting a healthy lifestyle and getting preventive care can go a long way to promote health and wellness. Talk with your health care provider about what schedule of regular examinations is right for you. This is a good chance for you to check in with your provider about disease prevention and staying healthy. In between checkups, there are plenty of things you can do on your own. Experts have done a lot of research about which lifestyle changes and preventive measures are most likely to keep you healthy. Ask your health care provider for more information. Weight and diet Eat a healthy diet  Be sure to include plenty of vegetables, fruits, low-fat dairy products, and lean protein.  Do not eat a lot of foods high in solid fats, added sugars, or salt.  Get regular exercise. This is one of the most important things you can do for your health.  Most adults should exercise for at least 150 minutes each week. The exercise should increase your heart rate and make you sweat (moderate-intensity exercise).  Most adults should also do strengthening exercises at least twice a week. This is in addition to the moderate-intensity exercise. Maintain a healthy weight  Body mass index (BMI) is a measurement that can be used to identify possible weight problems. It estimates body fat based on height and weight. Your health care provider can help determine your BMI and help you achieve or maintain a healthy weight.  For females 38 years of age and older:  A BMI below 18.5 is considered underweight.  A BMI of 18.5 to 24.9 is normal.  A BMI of 25 to 29.9 is considered overweight.  A BMI of 30 and above is considered obese. Watch levels  of cholesterol and blood lipids  You should start having your blood tested for lipids and cholesterol at 66 years of age, then have this test every 5 years.  You may need to have your cholesterol levels checked more often if:  Your lipid or cholesterol levels are high.  You are older than 66 years of age.  You are at high risk for heart disease. Cancer screening Lung Cancer  Lung cancer screening is recommended for adults 26-90 years old who are at high risk for lung cancer because of a history of smoking.  A yearly low-dose CT scan of the lungs is recommended for people who:  Currently smoke.  Have quit within the past 15 years.  Have at least a 30-pack-year history of smoking. A pack year is smoking an average of one pack of cigarettes a day for 1 year.  Yearly screening should continue until it has been 15 years since you quit.  Yearly screening should stop if you develop a health problem that would prevent you from having lung cancer treatment. Breast Cancer  Practice breast self-awareness. This means understanding how your breasts normally appear and feel.  It also means doing regular breast self-exams. Let your health care provider know about any changes, no matter how small.  If you are in your 20s or 30s, you should have a clinical breast exam (CBE) by a health care provider every 1-3 years as part of a regular health  exam.  If you are 40 or older, have a CBE every year. Also consider having a breast X-ray (mammogram) every year.  If you have a family history of breast cancer, talk to your health care provider about genetic screening.  If you are at high risk for breast cancer, talk to your health care provider about having an MRI and a mammogram every year.  Breast cancer gene (BRCA) assessment is recommended for women who have family members with BRCA-related cancers. BRCA-related cancers include:  Breast.  Ovarian.  Tubal.  Peritoneal cancers.  Results of  the assessment will determine the need for genetic counseling and BRCA1 and BRCA2 testing. Cervical Cancer  Your health care provider may recommend that you be screened regularly for cancer of the pelvic organs (ovaries, uterus, and vagina). This screening involves a pelvic examination, including checking for microscopic changes to the surface of your cervix (Pap test). You may be encouraged to have this screening done every 3 years, beginning at age 26.  For women ages 29-65, health care providers may recommend pelvic exams and Pap testing every 3 years, or they may recommend the Pap and pelvic exam, combined with testing for human papilloma virus (HPV), every 5 years. Some types of HPV increase your risk of cervical cancer. Testing for HPV may also be done on women of any age with unclear Pap test results.  Other health care providers may not recommend any screening for nonpregnant women who are considered low risk for pelvic cancer and who do not have symptoms. Ask your health care provider if a screening pelvic exam is right for you.  If you have had past treatment for cervical cancer or a condition that could lead to cancer, you need Pap tests and screening for cancer for at least 20 years after your treatment. If Pap tests have been discontinued, your risk factors (such as having a new sexual partner) need to be reassessed to determine if screening should resume. Some women have medical problems that increase the chance of getting cervical cancer. In these cases, your health care provider may recommend more frequent screening and Pap tests. Colorectal Cancer  This type of cancer can be detected and often prevented.  Routine colorectal cancer screening usually begins at 66 years of age and continues through 66 years of age.  Your health care provider may recommend screening at an earlier age if you have risk factors for colon cancer.  Your health care provider may also recommend using home test  kits to check for hidden blood in the stool.  A small camera at the end of a tube can be used to examine your colon directly (sigmoidoscopy or colonoscopy). This is done to check for the earliest forms of colorectal cancer.  Routine screening usually begins at age 65.  Direct examination of the colon should be repeated every 5-10 years through 66 years of age. However, you may need to be screened more often if early forms of precancerous polyps or small growths are found. Skin Cancer  Check your skin from head to toe regularly.  Tell your health care provider about any new moles or changes in moles, especially if there is a change in a mole's shape or color.  Also tell your health care provider if you have a mole that is larger than the size of a pencil eraser.  Always use sunscreen. Apply sunscreen liberally and repeatedly throughout the day.  Protect yourself by wearing long sleeves, pants, a wide-brimmed hat, and  sunglasses whenever you are outside. Heart disease, diabetes, and high blood pressure  High blood pressure causes heart disease and increases the risk of stroke. High blood pressure is more likely to develop in:  People who have blood pressure in the high end of the normal range (130-139/85-89 mm Hg).  People who are overweight or obese.  People who are African American.  If you are 5-46 years of age, have your blood pressure checked every 3-5 years. If you are 65 years of age or older, have your blood pressure checked every year. You should have your blood pressure measured twice-once when you are at a hospital or clinic, and once when you are not at a hospital or clinic. Record the average of the two measurements. To check your blood pressure when you are not at a hospital or clinic, you can use:  An automated blood pressure machine at a pharmacy.  A home blood pressure monitor.  If you are between 57 years and 75 years old, ask your health care provider if you should  take aspirin to prevent strokes.  Have regular diabetes screenings. This involves taking a blood sample to check your fasting blood sugar level.  If you are at a normal weight and have a low risk for diabetes, have this test once every three years after 66 years of age.  If you are overweight and have a high risk for diabetes, consider being tested at a younger age or more often. Preventing infection Hepatitis B  If you have a higher risk for hepatitis B, you should be screened for this virus. You are considered at high risk for hepatitis B if:  You were born in a country where hepatitis B is common. Ask your health care provider which countries are considered high risk.  Your parents were born in a high-risk country, and you have not been immunized against hepatitis B (hepatitis B vaccine).  You have HIV or AIDS.  You use needles to inject street drugs.  You live with someone who has hepatitis B.  You have had sex with someone who has hepatitis B.  You get hemodialysis treatment.  You take certain medicines for conditions, including cancer, organ transplantation, and autoimmune conditions. Hepatitis C  Blood testing is recommended for:  Everyone born from [birth year] through 1965.  Anyone with known risk factors for hepatitis C. Sexually transmitted infections (STIs)  You should be screened for sexually transmitted infections (STIs) including gonorrhea and chlamydia if:  You are sexually active and are younger than 66 years of age.  You are older than 67 years of age and your health care provider tells you that you are at risk for this type of infection.  Your sexual activity has changed since you were last screened and you are at an increased risk for chlamydia or gonorrhea. Ask your health care provider if you are at risk.  If you do not have HIV, but are at risk, it may be recommended that you take a prescription medicine daily to prevent HIV infection. This is called  pre-exposure prophylaxis (PrEP). You are considered at risk if:  You are sexually active and do not regularly use condoms or know the HIV status of your partner(s).  You take drugs by injection.  You are sexually active with a partner who has HIV. Talk with your health care provider about whether you are at high risk of being infected with HIV. If you choose to begin PrEP, you should first be tested  for HIV. You should then be tested every 3 months for as long as you are taking PrEP. Pregnancy  If you are premenopausal and you may become pregnant, ask your health care provider about preconception counseling.  If you may become pregnant, take 400 to 800 micrograms (mcg) of folic acid every day.  If you want to prevent pregnancy, talk to your health care provider about birth control (contraception). Osteoporosis and menopause  Osteoporosis is a disease in which the bones lose minerals and strength with aging. This can result in serious bone fractures. Your risk for osteoporosis can be identified using a bone density scan.  If you are 86 years of age or older, or if you are at risk for osteoporosis and fractures, ask your health care provider if you should be screened.  Ask your health care provider whether you should take a calcium or vitamin D supplement to lower your risk for osteoporosis.  Menopause may have certain physical symptoms and risks.  Hormone replacement therapy may reduce some of these symptoms and risks. Talk to your health care provider about whether hormone replacement therapy is right for you. Follow these instructions at home:  Schedule regular health, dental, and eye exams.  Stay current with your immunizations.  Do not use any tobacco products including cigarettes, chewing tobacco, or electronic cigarettes.  If you are pregnant, do not drink alcohol.  If you are breastfeeding, limit how much and how often you drink alcohol.  Limit alcohol intake to no more  than 1 drink per day for nonpregnant women. One drink equals 12 ounces of beer, 5 ounces of wine, or 1 ounces of hard liquor.  Do not use street drugs.  Do not share needles.  Ask your health care provider for help if you need support or information about quitting drugs.  Tell your health care provider if you often feel depressed.  Tell your health care provider if you have ever been abused or do not feel safe at home. This information is not intended to replace advice given to you by your health care provider. Make sure you discuss any questions you have with your health care provider. Document Released: 10/09/2010 Document Revised: 09/01/2015 Document Reviewed: 12/28/2014 Elsevier Interactive Patient Education  2017 [employer].

## 2016-07-04 NOTE — Progress Notes (Addendum)
[facility] [address] [place], [place] [address] [phone]  Date:  07/04/2016   Name:  Margaret Murphy   DOB:  1950-08-10   MRN:  [id number]  PCP:  No primary care provider on file.    Chief Complaint: Annual Exam   History of Present Illness:  Margaret Murphy is a 66 y.o. very pleasant female patient who presents with the following:  Here as a new patient today, although I have see her once at [facility]. She is fasting today for labs She is generally in good health  Last labs about one year ago- her A1c was 6% at that time Her total cholesterol was a bit high but total to HDL ratio looked fine  Last pap 2011 in our records. However she does see GYN and had a pap last year, her doctor is at [facility].  Mammogram last week She has not yet had a pneumonia vaccine dexa scan? Not done yet  She has been doing well overall Her BP is generally under good control- she does check it at home  Her family is doing well She does get regular exercise- she does a routine at home, and she likes to walk outdoors  She declines a pneumonia vaccine today- she will keep this in mind  Patient Active Problem List   Diagnosis Date Noted  . Pre-diabetes 05/12/2015  . HTN (hypertension) 12/19/2011  . Depression with anxiety 12/19/2011  . Hypercholesteremia 12/19/2011  . History of urinary tract infection 12/19/2011    Past Medical History:  Diagnosis Date  . Anxiety   . Depression   . Hypertension     Past Surgical History:  Procedure Laterality Date  . CESAREAN SECTION     #1 1978  and #2 1989    Social History  Substance Use Topics  . Smoking status: Never Smoker  . Smokeless tobacco: Never Used  . Alcohol use No    Family History  Problem Relation Age of Onset  . Hypertension Mother     dx in 22's- not sure of age  . Diabetes Mother   . Hypertension Maternal Grandmother     does not know age of dx  . Glaucoma  Father   . Hypertension Sister   . Diabetes Brother   . Cancer Maternal Aunt 16    Breast cancer  . Hypertension Sister   . Hypertension Maternal Uncle     Allergies  Allergen Reactions  . Diuretic [Buchu-Cornsilk-Ch Grass-Hydran] Other (See Comments)    cramps  . Metoprolol Nausea And Vomiting  . Sulfonamide Derivatives Other (See Comments)    GI UPSET    Medication list has been reviewed and updated.  Current Outpatient Prescriptions on File Prior to Visit  Medication Sig Dispense Refill  . aspirin 325 MG tablet Take 325 mg by mouth every 4 (four) hours as needed for mild pain, moderate pain, fever or headache. Reported on 05/11/2015    . hyoscyamine (LEVSIN, ANASPAZ) 0.125 MG tablet Take 1 tablet (0.125 mg total) by mouth as needed. Max 1.5 mg in 24 hours 60 tablet 2  . ibuprofen (ADVIL,MOTRIN) 200 MG tablet Take 400 mg by mouth every 8 (eight) hours as needed for fever, headache, mild pain, moderate pain or cramping.    . methocarbamol (ROBAXIN) 500 MG tablet Take 1 tablet (500 mg total) by mouth every 8 (eight) hours as needed. Use for muscle spasm 30 tablet 0  .  ondansetron (ZOFRAN ODT) 8 MG disintegrating tablet Take 1 tablet (8 mg total) by mouth every 8 (eight) hours as needed for nausea or vomiting. 10 tablet 0  . traMADol (ULTRAM) 50 MG tablet Take 1 tablet (50 mg total) by mouth every 6 (six) hours as needed. 20 tablet 0   No current facility-administered medications on file prior to visit.     Review of Systems:  As per HPI- otherwise negative.    Physical Examination: Vitals:   07/04/16 1207  BP: (!) 150/76  Pulse: 68  Temp: 98 F (36.7 C)   Vitals:   07/04/16 1207  Weight: 121 lb (54.9 kg)  Height: 5\' 5"  (1.651 m)   Body mass index is 20.14 kg/m. Ideal Body Weight: Weight in (lb) to have BMI = 25: 149.9  GEN: WDWN, NAD, Non-toxic, A & O x 3, slim build, looks well HEENT: Atraumatic, Normocephalic. Neck supple. No masses, No LAD.  Bilateral TM wnl,  oropharynx normal.  PEERL,EOMI.   Ears and Nose: No external deformity. CV: RRR, No M/G/R. No JVD. No thrill. No extra heart sounds. PULM: CTA B, no wheezes, crackles, rhonchi. No retractions. No resp. distress. No accessory muscle use. ABD: S, NT, ND. No rebound. No HSM.  Benign belly EXTR: No c/c/e NEURO Normal gait.  PSYCH: Normally interactive. Conversant. Not depressed or anxious appearing.  Calm demeanor.    Assessment and Plan:    Physical exam  Irritable bowel syndrome, unspecified type - Plan: hyoscyamine (LEVSIN, ANASPAZ) 0.125 MG tablet  Screening for deficiency anemia - Plan: CBC  Elevated hemoglobin A1c - Plan: Comprehensive metabolic panel, Hemoglobin A1c  Dyslipidemia - Plan: Lipid panel  Estrogen deficiency - Plan: DG Bone Density  Pt who I know from [facility], here for a CPE today Declines a flu or pneumonia vaccine Refilled her IBS meds- however she uses this only on occasion Labs pending as above Set up for dexa scan She will monitor her BP at home and contact me if consistently high   Letter to pt [id number]  Signed Abbe Amsterdam, MD  Results for orders placed or performed in visit on 07/04/16  CBC  Result Value Ref Range   WBC 3.9 (L) 4.0 - 10.5 K/uL   RBC 4.13 3.87 - 5.11 Mil/uL   Platelets 247.0 150.0 - 400.0 K/uL   Hemoglobin 11.8 (L) 12.0 - 15.0 g/dL   HCT 04.5 40.9 - 81.1 %   MCV 89.1 78.0 - 100.0 fl   MCHC 32.2 30.0 - 36.0 g/dL   RDW 91.4 78.2 - 95.6 %  Comprehensive metabolic panel  Result Value Ref Range   Sodium 141 135 - 145 mEq/L   Potassium 3.8 3.5 - 5.1 mEq/L   Chloride 106 96 - 112 mEq/L   CO2 30 19 - 32 mEq/L   Glucose, Bld 92 70 - 99 mg/dL   BUN 20 6 - 23 mg/dL   Creatinine, Ser 2.13 0.40 - 1.20 mg/dL   Total Bilirubin 0.5 0.2 - 1.2 mg/dL   Alkaline Phosphatase 37 (L) 39 - 117 U/L   AST 20 0 - 37 U/L   ALT 13 0 - 35 U/L   Total Protein 7.4 6.0 - 8.3 g/dL   Albumin 4.5 3.5 - 5.2 g/dL   Calcium 9.7 8.4 - 08.6 mg/dL   GFR  57.84 >69.62 mL/min  Lipid panel  Result Value Ref Range   Cholesterol 223 (H) 0 - 200 mg/dL   Triglycerides 95.2 0.0 - 149.0 mg/dL  HDL 85.40 >39.00 mg/dL   VLDL 62.111.8 0.0 - 30.840.0 mg/dL   LDL Cholesterol 657126 (H) 0 - 99 mg/dL   Total CHOL/HDL Ratio 3    NonHDL 138.09   Hemoglobin A1c  Result Value Ref Range   Hgb A1c MFr Bld 6.1 4.6 - 6.5 %

## 2016-07-04 NOTE — Progress Notes (Signed)
Pre visit review using our clinic review tool, if applicable. No additional management support is needed unless otherwise documented below in the visit note. 

## 2017-12-29 NOTE — Progress Notes (Addendum)
[facility] [address], [address] [place], [place] [address] [phone] [phone]  Date:  01/01/2018   Name:  Margaret LarryGwendolyn Prabhu   DOB:  07/25/1950   MRN:  [id number]  PCP:  Pearline Cablesopland, Khrystina Bonnes C, MD    Chief Complaint: Annual Exam (no flu shot, shingrix-at pharmacy)   History of Present Illness:  Margaret Murphy is a 67 y.o. very pleasant female patient who presents with the following:  Here today for a CPE History of hyperlipidemia, HTN, pre-diabetes Last seen here 3/18:  Here as a new patient today, although I have [facility]. She is fasting today for labs She is generally in good health Last labs about one year ago- her A1c was 6% at that time Her total cholesterol was a bit high but total to HDL ratio looked fine Last pap 2011 in our records. However she does see GYN and had a pap last year, [facility]  Mammogram last week She has been doing well overall Her BP is generally under good control- she does check it at home Her family is doing well She does get regular exercise- she does a routine at home, and she likes to walk outdoors She declines a pneumonia vaccine today- she will keep this in mind  She continues to be in good health [place] and are [place] in great health, this is a stressor. She does have support from her sibs who live nearer to her parents  Her back is no longer bothering her as it did last year  However she does have some occasional left knee discomfort. Had a steroid injection about 10 years ago which seemed to help a lot- she may go back to ortho to do this again  Pap: this was done 05/2015 per GYN, normal  Mamm[facility]: 3/18, [facility]: due- she is going to come in on Friday to have these drawn as not fasting today Immun: flu, pneumonia, shingrix due She declines a flu shot today, and also pneumonia vaccine  She would like to do a shingrix but it  has not yet been 5 years since her pneumovax  Colon: 2012, 10 year follow-up Dexa: will order for her today and she plans to have this done with her upcoming mammo if she can  Patient Active Problem List   Diagnosis Date Noted  . Pre-diabetes 05/12/2015  . HTN (hypertension) 12/19/2011  . Depression with anxiety 12/19/2011  . Hypercholesteremia 12/19/2011  . History of urinary tract infection 12/19/2011    Past Medical History:  Diagnosis Date  . Anxiety   . Depression   . Hypertension     Past Surgical History:  Procedure Laterality Date  . CESAREAN SECTION     #1 1978  and #2 1989    Social History   Tobacco Use  . Smoking status: Never Smoker  . Smokeless tobacco: Never Used  Substance Use Topics  . Alcohol use: No  . Drug use: No    Family History  Problem Relation Age of Onset  . Hypertension Mother        dx in 2230's- not sure of age  . Diabetes Mother   . Hypertension Maternal Grandmother        does not know age of dx  . Glaucoma Father   . Hypertension Sister   . Diabetes Brother   . Cancer Maternal  Aunt 78       Breast cancer  . Hypertension Sister   . Hypertension Maternal Uncle     Allergies  Allergen Reactions  . Diuretic [Buchu-Cornsilk-Ch Grass-Hydran] Other (See Comments)    cramps  . Metoprolol Nausea And Vomiting  . Sulfonamide Derivatives Other (See Comments)    GI UPSET    Medication list has been reviewed and updated.  Current Outpatient Medications on File Prior to Visit  Medication Sig Dispense Refill  . aspirin 325 MG tablet Take 325 mg by mouth every 4 (four) hours as needed for mild pain, moderate pain, fever or headache. Reported on 05/11/2015    . Biotin [address]109 MCG TABS Take by mouth.    . ciclopirox (LOPROX) 0.77 % cream Apply topically 2 (two) times daily.    Marland Kitchen ibuprofen (ADVIL,MOTRIN) 200 MG tablet Take 400 mg by mouth every 8 (eight) hours as needed for fever, headache, mild pain, moderate pain or cramping.    . Multiple  Vitamins-Minerals (CENTRUM SILVER 50+WOMEN PO) Take by mouth.    Marland Kitchen UNABLE TO FIND Med Name: Hempvana pain relief cream for knee pain    . traMADol (ULTRAM) 50 MG tablet Take 1 tablet (50 mg total) by mouth every 6 (six) hours as needed. 20 tablet 0   No current facility-administered medications on file prior to visit.     Review of Systems:  As per HPI- otherwise negative. No fever or chills No CP or SOB She is not doing as much walking but plans to do better  No PMB   Physical Examination: Vitals:   01/01/18 1330  BP: (!) 144/90  Pulse: 82  Resp: 16  Temp: 98.2 F (36.8 C)  SpO2: 99%   Vitals:   01/01/18 1330  Weight: 124 lb 12.8 oz (56.6 kg)  Height: 5\' 5"  (1.651 m)   Body mass index is 20.77 kg/m. Ideal Body Weight: Weight in (lb) to have BMI = 25: 149.9  GEN: WDWN, NAD, Non-toxic, A & O x 3, slim build, looks well  HEENT: Atraumatic, Normocephalic. Neck supple. No masses, No LAD.  Bilateral TM wnl, oropharynx normal.  PEERL,EOMI.   Ears and Nose: No external deformity. CV: RRR, No M/G/R. No JVD. No thrill. No extra heart sounds. PULM: CTA B, no wheezes, crackles, rhonchi. No retractions. No resp. distress. No accessory muscle use. ABD: S, NT, ND EXTR: No c/c/e NEURO Normal gait.  PSYCH: Normally interactive. Conversant. Not depressed or anxious appearing.  Calm demeanor.    Assessment and Plan: Physical exam  Hypertension, unspecified type - Plan: CBC, Comprehensive metabolic panel  Hypercholesteremia - Plan: Lipid panel  Elevated hemoglobin A1c - Plan: CBC, Hemoglobin A1c  Screening for osteoporosis - Plan: DG Bone Density  History of iron deficiency - Plan: Ferritin  Tinea pedis of both feet - Plan: ciclopirox (LOPROX) 0.77 % cream  CPE today She will come in for her labs on Friday Schedule dexa mammo next week See patient instructions for more details.   BP - she was on medication years ago but it dropped her BP too low.  Will continue to  monitor for now, her BP is reasonable     Signed Abbe Amsterdam, MD  Received her labs 10/4- letter to pt  10 year CV risk 10.6%  Lab Results  Component Value Date   WBC 3.1 (L) 01/07/2018   HGB 11.8 (L) 01/07/2018   HCT 36.6 01/07/2018   MCV 88.4 01/07/2018   PLT 230.0 01/07/2018  Lab Results  Component Value Date   FERRITIN 171.2 01/07/2018   Lab Results  Component Value Date   HGBA1C 5.8 01/07/2018   Lipids:    Component Value Date/Time   CHOL 221 (H) 01/07/2018 0828   TRIG 59.0 01/07/2018 0828   HDL 72.70 01/07/2018 0828   VLDL 11.8 01/07/2018 0828   CHOLHDL 3 01/07/2018 0828     Chemistry      Component Value Date/Time   NA 142 01/07/2018 0828   K 4.5 01/07/2018 0828   CL 106 01/07/2018 0828   CO2 30 01/07/2018 0828   BUN 17 01/07/2018 0828   CREATININE 0.83 01/07/2018 0828   CREATININE 0.80 05/11/2015 1535      Component Value Date/Time   CALCIUM 9.8 01/07/2018 0828   ALKPHOS 39 01/07/2018 0828   AST 18 01/07/2018 0828   ALT 14 01/07/2018 0828   BILITOT 0.4 01/07/2018 1610

## 2018-01-01 ENCOUNTER — Ambulatory Visit (INDEPENDENT_AMBULATORY_CARE_PROVIDER_SITE_OTHER): Payer: Medicare Other | Admitting: Family Medicine

## 2018-01-01 ENCOUNTER — Encounter: Payer: Self-pay | Admitting: Family Medicine

## 2018-01-01 VITALS — BP 130/90 | HR 82 | Temp 98.2°F | Resp 16 | Ht 65.0 in | Wt 124.8 lb

## 2018-01-01 DIAGNOSIS — Z0000 Encounter for general adult medical examination without abnormal findings: Secondary | ICD-10-CM

## 2018-01-01 DIAGNOSIS — R7309 Other abnormal glucose: Secondary | ICD-10-CM

## 2018-01-01 DIAGNOSIS — Z13820 Encounter for screening for osteoporosis: Secondary | ICD-10-CM

## 2018-01-01 DIAGNOSIS — Z8639 Personal history of other endocrine, nutritional and metabolic disease: Secondary | ICD-10-CM

## 2018-01-01 DIAGNOSIS — E7800 Pure hypercholesterolemia, unspecified: Secondary | ICD-10-CM | POA: Diagnosis not present

## 2018-01-01 DIAGNOSIS — D72819 Decreased white blood cell count, unspecified: Secondary | ICD-10-CM

## 2018-01-01 DIAGNOSIS — I10 Essential (primary) hypertension: Secondary | ICD-10-CM

## 2018-01-01 DIAGNOSIS — B353 Tinea pedis: Secondary | ICD-10-CM

## 2018-01-01 MED ORDER — CICLOPIROX OLAMINE 0.77 % EX CREA: TOPICAL_CREAM | Freq: Two times a day (BID) | CUTANEOUS | 1 refills | Status: DC

## 2018-01-01 NOTE — Patient Instructions (Signed)
Good to see you today- have a great day and I will be in touch with your labs asap You can have the shingles vaccine at your drug store at your convenience Please call [facility] and see if they can add a bone density to your mammo next week.   Continue to exercise and follow your healthy lifestyle I would recommend 2,000 IU of vitamin D daily and 1200 mg of calcium daily   Health Maintenance for Postmenopausal Women Menopause is a normal process in which your reproductive ability comes to an end. This process happens gradually over a span of months to years, usually between the ages of 80 and 53. Menopause is complete when you have missed 12 consecutive menstrual periods. It is important to talk with your health care provider about some of the most common conditions that affect postmenopausal women, such as heart disease, cancer, and bone loss (osteoporosis). Adopting a healthy lifestyle and getting preventive care can help to promote your health and wellness. Those actions can also lower your chances of developing some of these common conditions. What should I know about menopause? During menopause, you may experience a number of symptoms, such as:  Moderate-to-severe hot flashes.  Night sweats.  Decrease in sex drive.  Mood swings.  Headaches.  Tiredness.  Irritability.  Memory problems.  Insomnia.  Choosing to treat or not to treat menopausal changes is an individual decision that you make with your health care provider. What should I know about hormone replacement therapy and supplements? Hormone therapy products are effective for treating symptoms that are associated with menopause, such as hot flashes and night sweats. Hormone replacement carries certain risks, especially as you become older. If you are thinking about using estrogen or estrogen with progestin treatments, discuss the benefits and risks with your health care provider. What should I know about heart disease and  stroke? Heart disease, heart attack, and stroke become more likely as you age. This may be due, in part, to the hormonal changes that your body experiences during menopause. These can affect how your body processes dietary fats, triglycerides, and cholesterol. Heart attack and stroke are both medical emergencies. There are many things that you can do to help prevent heart disease and stroke:  Have your blood pressure checked at least every 1-2 years. High blood pressure causes heart disease and increases the risk of stroke.  If you are 68-61 years old, ask your health care provider if you should take aspirin to prevent a heart attack or a stroke.  Do not use any tobacco products, including cigarettes, chewing tobacco, or electronic cigarettes. If you need help quitting, ask your health care provider.  It is important to eat a healthy diet and maintain a healthy weight. ? Be sure to include plenty of vegetables, fruits, low-fat dairy products, and lean protein. ? Avoid eating foods that are high in solid fats, added sugars, or salt (sodium).  Get regular exercise. This is one of the most important things that you can do for your health. ? Try to exercise for at least 150 minutes each week. The type of exercise that you do should increase your heart rate and make you sweat. This is known as moderate-intensity exercise. ? Try to do strengthening exercises at least twice each week. Do these in addition to the moderate-intensity exercise.  Know your numbers.Ask your health care provider to check your cholesterol and your blood glucose. Continue to have your blood tested as directed by your health care  provider.  What should I know about cancer screening? There are several types of cancer. Take the following steps to reduce your risk and to catch any cancer development as early as possible. Breast Cancer  Practice breast self-awareness. ? This means understanding how your breasts normally appear  and feel. ? It also means doing regular breast self-exams. Let your health care provider know about any changes, no matter how small.  If you are 37 or older, have a clinician do a breast exam (clinical breast exam or CBE) every year. Depending on your age, family history, and medical history, it may be recommended that you also have a yearly breast X-ray (mammogram).  If you have a family history of breast cancer, talk with your health care provider about genetic screening.  If you are at high risk for breast cancer, talk with your health care provider about having an MRI and a mammogram every year.  Breast cancer (BRCA) gene test is recommended for women who have family members with BRCA-related cancers. Results of the assessment will determine the need for genetic counseling and BRCA1 and for BRCA2 testing. BRCA-related cancers include these types: ? Breast. This occurs in males or females. ? Ovarian. ? Tubal. This may also be called fallopian tube cancer. ? Cancer of the abdominal or pelvic lining (peritoneal cancer). ? Prostate. ? Pancreatic.  Cervical, Uterine, and Ovarian Cancer Your health care provider may recommend that you be screened regularly for cancer of the pelvic organs. These include your ovaries, uterus, and vagina. This screening involves a pelvic exam, which includes checking for microscopic changes to the surface of your cervix (Pap test).  For women ages 21-65, health care providers may recommend a pelvic exam and a Pap test every three years. For women ages 54-65, they may recommend the Pap test and pelvic exam, combined with testing for human papilloma virus (HPV), every five years. Some types of HPV increase your risk of cervical cancer. Testing for HPV may also be done on women of any age who have unclear Pap test results.  Other health care providers may not recommend any screening for nonpregnant women who are considered low risk for pelvic cancer and have no  symptoms. Ask your health care provider if a screening pelvic exam is right for you.  If you have had past treatment for cervical cancer or a condition that could lead to cancer, you need Pap tests and screening for cancer for at least 20 years after your treatment. If Pap tests have been discontinued for you, your risk factors (such as having a new sexual partner) need to be reassessed to determine if you should start having screenings again. Some women have medical problems that increase the chance of getting cervical cancer. In these cases, your health care provider may recommend that you have screening and Pap tests more often.  If you have a family history of uterine cancer or ovarian cancer, talk with your health care provider about genetic screening.  If you have vaginal bleeding after reaching menopause, tell your health care provider.  There are currently no reliable tests available to screen for ovarian cancer.  Lung Cancer Lung cancer screening is recommended for adults 46-1 years old who are at high risk for lung cancer because of a history of smoking. A yearly low-dose CT scan of the lungs is recommended if you:  Currently smoke.  Have a history of at least 30 pack-years of smoking and you currently smoke or have quit within  the past 15 years. A pack-year is smoking an average of one pack of cigarettes per day for one year.  Yearly screening should:  Continue until it has been 15 years since you quit.  Stop if you develop a health problem that would prevent you from having lung cancer treatment.  Colorectal Cancer  This type of cancer can be detected and can often be prevented.  Routine colorectal cancer screening usually begins at age 81 and continues through age 20.  If you have risk factors for colon cancer, your health care provider may recommend that you be screened at an earlier age.  If you have a family history of colorectal cancer, talk with your health care  provider about genetic screening.  Your health care provider may also recommend using home test kits to check for hidden blood in your stool.  A small camera at the end of a tube can be used to examine your colon directly (sigmoidoscopy or colonoscopy). This is done to check for the earliest forms of colorectal cancer.  Direct examination of the colon should be repeated every 5-10 years until age 65. However, if early forms of precancerous polyps or small growths are found or if you have a family history or genetic risk for colorectal cancer, you may need to be screened more often.  Skin Cancer  Check your skin from head to toe regularly.  Monitor any moles. Be sure to tell your health care provider: ? About any new moles or changes in moles, especially if there is a change in a mole's shape or color. ? If you have a mole that is larger than the size of a pencil eraser.  If any of your family members has a history of skin cancer, especially at a young age, talk with your health care provider about genetic screening.  Always use sunscreen. Apply sunscreen liberally and repeatedly throughout the day.  Whenever you are outside, protect yourself by wearing long sleeves, pants, a wide-brimmed hat, and sunglasses.  What should I know about osteoporosis? Osteoporosis is a condition in which bone destruction happens more quickly than new bone creation. After menopause, you may be at an increased risk for osteoporosis. To help prevent osteoporosis or the bone fractures that can happen because of osteoporosis, the following is recommended:  If you are 58-27 years old, get at least 1,000 mg of calcium and at least 600 mg of vitamin D per day.  If you are older than age 70 but younger than age 74, get at least 1,200 mg of calcium and at least 600 mg of vitamin D per day.  If you are older than age 32, get at least 1,200 mg of calcium and at least 800 mg of vitamin D per day.  Smoking and excessive  alcohol intake increase the risk of osteoporosis. Eat foods that are rich in calcium and vitamin D, and do weight-bearing exercises several times each week as directed by your health care provider. What should I know about how menopause affects my mental health? Depression may occur at any age, but it is more common as you become older. Common symptoms of depression include:  Low or sad mood.  Changes in sleep patterns.  Changes in appetite or eating patterns.  Feeling an overall lack of motivation or enjoyment of activities that you previously enjoyed.  Frequent crying spells.  Talk with your health care provider if you think that you are experiencing depression. What should I know about immunizations? It  is important that you get and maintain your immunizations. These include:  Tetanus, diphtheria, and pertussis (Tdap) booster vaccine.  Influenza every year before the flu season begins.  Pneumonia vaccine.  Shingles vaccine.  Your health care provider may also recommend other immunizations. This information is not intended to replace advice given to you by your health care provider. Make sure you discuss any questions you have with your health care provider. Document Released: 05/18/2005 Document Revised: 10/14/2015 Document Reviewed: 12/28/2014 Elsevier Interactive Patient Education  2018 [employer].

## 2018-01-03 ENCOUNTER — Other Ambulatory Visit: Payer: Medicare Other

## 2018-01-06 ENCOUNTER — Telehealth: Payer: Self-pay | Admitting: *Deleted

## 2018-01-06 NOTE — Telephone Encounter (Signed)
[facility] called for order for bone density.  Order faxed over to [facility].

## 2018-01-06 NOTE — Telephone Encounter (Signed)
Received Physician Orders from [facility]; forwarded to provider/SLS 09/30

## 2018-01-07 ENCOUNTER — Other Ambulatory Visit (INDEPENDENT_AMBULATORY_CARE_PROVIDER_SITE_OTHER): Payer: Medicare Other

## 2018-01-07 DIAGNOSIS — R7309 Other abnormal glucose: Secondary | ICD-10-CM | POA: Diagnosis not present

## 2018-01-07 DIAGNOSIS — E7800 Pure hypercholesterolemia, unspecified: Secondary | ICD-10-CM | POA: Diagnosis not present

## 2018-01-07 DIAGNOSIS — Z8639 Personal history of other endocrine, nutritional and metabolic disease: Secondary | ICD-10-CM

## 2018-01-07 DIAGNOSIS — I10 Essential (primary) hypertension: Secondary | ICD-10-CM | POA: Diagnosis not present

## 2018-01-07 LAB — CBC
HCT: 36.6 % (ref 36.0–46.0)
HEMOGLOBIN: 11.8 g/dL — AB (ref 12.0–15.0)
MCHC: 32.3 g/dL (ref 30.0–36.0)
MCV: 88.4 fl (ref 78.0–100.0)
PLATELETS: 230 10*3/uL (ref 150.0–400.0)
RBC: 4.14 Mil/uL (ref 3.87–5.11)
RDW: 13.6 % (ref 11.5–15.5)
WBC: 3.1 10*3/uL — ABNORMAL LOW (ref 4.0–10.5)

## 2018-01-07 LAB — COMPREHENSIVE METABOLIC PANEL
ALK PHOS: 39 U/L (ref 39–117)
ALT: 14 U/L (ref 0–35)
AST: 18 U/L (ref 0–37)
Albumin: 4.1 g/dL (ref 3.5–5.2)
BILIRUBIN TOTAL: 0.4 mg/dL (ref 0.2–1.2)
BUN: 17 mg/dL (ref 6–23)
CHLORIDE: 106 meq/L (ref 96–112)
CO2: 30 mEq/L (ref 19–32)
CREATININE: 0.83 mg/dL (ref 0.40–1.20)
Calcium: 9.8 mg/dL (ref 8.4–10.5)
GFR: 88.21 mL/min (ref >60.00)
Glucose, Bld: 90 mg/dL (ref 70–99)
Potassium: 4.5 mEq/L (ref 3.5–5.1)
Sodium: 142 mEq/L (ref 135–145)
Total Protein: 6.7 g/dL (ref 6.0–8.3)

## 2018-01-07 LAB — HEMOGLOBIN A1C: HEMOGLOBIN A1C: 5.8 % (ref 4.6–6.5)

## 2018-01-07 LAB — LIPID PANEL
CHOLESTEROL: 221 mg/dL — AB (ref 0–200)
HDL: 72.7 mg/dL (ref >39.00)
LDL Cholesterol: 137 mg/dL — ABNORMAL HIGH (ref 0–99)
NONHDL: 148.67
Total CHOL/HDL Ratio: 3
Triglycerides: 59 mg/dL (ref 0.0–149.0)
VLDL: 11.8 mg/dL (ref 0.0–40.0)

## 2018-01-07 LAB — FERRITIN: FERRITIN: 171.2 ng/mL (ref 10.0–291.0)

## 2018-01-08 ENCOUNTER — Telehealth: Payer: Self-pay | Admitting: Family Medicine

## 2018-01-08 ENCOUNTER — Telehealth: Payer: Self-pay | Admitting: *Deleted

## 2018-01-08 DIAGNOSIS — M810 Age-related osteoporosis without current pathological fracture: Secondary | ICD-10-CM | POA: Insufficient documentation

## 2018-01-08 NOTE — Telephone Encounter (Signed)
Received Bone Density results from [facility]; forwarded to provider/SLS 10/02

## 2018-01-08 NOTE — Telephone Encounter (Signed)
Called pt- left detailed message. mammo ok but her dexa shows osteoporosis.  Would she like to start oral fosamax?  Please let me know JC

## 2018-01-10 NOTE — Addendum Note (Signed)
Addended by: Abbe Amsterdam C on: 01/10/2018 06:56 AM   Modules accepted: Orders

## 2018-01-14 ENCOUNTER — Encounter: Payer: Self-pay | Admitting: Family Medicine

## 2018-02-04 ENCOUNTER — Telehealth: Payer: Self-pay

## 2018-02-04 NOTE — Telephone Encounter (Signed)
-----   Message from Pearline Cables, MD sent at 02/04/2018  5:30 AM EDT ----- Hi- can you please call [facility] and ask them to fax [name] a copy of her dexa scan asap.  She is coming in to discuss on Wednesday and it is not in the chart Thx!

## 2018-02-04 NOTE — Progress Notes (Signed)
[facility] [address] [place], [place] [address] [phone]  Date:  02/05/2018   Name:  Margaret Murphy   DOB:  04-26-1950   MRN:  [id number]  PCP:  Pearline Cables, MD    Chief Complaint: Discuss Lab Results   History of Present Illness:  Margaret Murphy is a 67 y.o. very pleasant female patient who presents with the following:  Here today for a follow-up visit History of osteoporosis,mild HTN, pre-diabetes, hyperlipidemia She is not on any medication for her HTN   BP Readings from Last 3 Encounters:  02/05/18 (!) 160/90  01/01/18 130/90  07/04/16 132/80    Lab Results  Component Value Date   HGBA1C 5.8 01/07/2018   Recent dexa showed osteoporosis.   This report was done at [facility] and I was able to call and get a copy faxed to me today  Other lab notes as follows: Your iron- ferritin- level is normal You are just minimally anemic- however not to the point that I would be concerned Your white blood cell count is a bit low.  Looking back you tend to run on the low side of normal, this is likely just how you are made.  However, I would like to repeat a blood count (lab appointment only) in about 1 month to make sure this comes back up.  Please schedule a lab visit for one month from now A1c is in the pre-diabetes range.  Improved from last check!  Your cholesterol is not bad overall.  I calculated your estimated 10 year risk of heart attack/ stroke to be right at 10% with these numbers.  We might consider a cholesterol medication for you in hopes of reducing this risk.  Please let me know if this is something you would like to use  Metabolic profile is normal.  [name] is concerned about her bone density but also does not feel great about starting any treatment for same.  She read about various meds for osteoporosis and has concerns about them.  She wonders if she must start treatment now.  We discussed in  detail. We decided to repeat a dexa in one year and look for any change.  In the meantime she will continue weight bearing exercise, vit D and calcium  We will also repeat her CBC today   Patient Active Problem List   Diagnosis Date Noted  . Osteoporosis 01/08/2018  . Pre-diabetes 05/12/2015  . HTN (hypertension) 12/19/2011  . Depression with anxiety 12/19/2011  . Hypercholesteremia 12/19/2011  . History of urinary tract infection 12/19/2011    Past Medical History:  Diagnosis Date  . Anxiety   . Depression   . Hypertension     Past Surgical History:  Procedure Laterality Date  . CESAREAN SECTION     #1 1978  and #2 1989    Social History   Tobacco Use  . Smoking status: Never Smoker  . Smokeless tobacco: Never Used  Substance Use Topics  . Alcohol use: No  . Drug use: No    Family History  Problem Relation Age of Onset  . Hypertension Mother        dx in 42's- not sure of age  . Diabetes Mother   . Hypertension Maternal Grandmother        does not know age of dx  . Glaucoma Father   . Hypertension Sister   . Diabetes Brother   .  Cancer Maternal Aunt 57       Breast cancer  . Hypertension Sister   . Hypertension Maternal Uncle     Allergies  Allergen Reactions  . Diuretic [Buchu-Cornsilk-Ch Grass-Hydran] Other (See Comments)    cramps  . Metoprolol Nausea And Vomiting  . Sulfonamide Derivatives Other (See Comments)    GI UPSET    Medication list has been reviewed and updated.  Current Outpatient Medications on File Prior to Visit  Medication Sig Dispense Refill  . aspirin 325 MG tablet Take 325 mg by mouth every 4 (four) hours as needed for mild pain, moderate pain, fever or headache. Reported on 05/11/2015    . Biotin [address] MCG TABS Take by mouth.    . ciclopirox (LOPROX) 0.77 % cream Apply topically 2 (two) times daily. Use as needed for foot rash 30 g 1  . ibuprofen (ADVIL,MOTRIN) 200 MG tablet Take 400 mg by mouth every 8 (eight) hours as needed  for fever, headache, mild pain, moderate pain or cramping.    . Multiple Vitamins-Minerals (CENTRUM SILVER 50+WOMEN PO) Take by mouth.    . traMADol (ULTRAM) 50 MG tablet Take 1 tablet (50 mg total) by mouth every 6 (six) hours as needed. 20 tablet 0  . UNABLE TO FIND Med Name: Hempvana pain relief cream for knee pain     No current facility-administered medications on file prior to visit.     Review of Systems:  As per HPI- otherwise negative. No fever or chills No CP or SOB   Physical Examination: Vitals:   02/05/18 0929  BP: (!) 160/90  Pulse: (!) 56  Resp: 16  Temp: 97.9 F (36.6 C)  SpO2: 98%   Vitals:   02/05/18 0929  Weight: 125 lb (56.7 kg)  Height: 5\' 5"  (1.651 m)   Body mass index is 20.8 kg/m. Ideal Body Weight: Weight in (lb) to have BMI = 25: 149.9  GEN: WDWN, NAD, Non-toxic, A & O x 3, lokos well, normal weight  HEENT: Atraumatic, Normocephalic. Neck supple. No masses, No LAD. Ears and Nose: No external deformity. CV: RRR, No M/G/R. No JVD. No thrill. No extra heart sounds. PULM: CTA B, no wheezes, crackles, rhonchi. No retractions. No resp. distress. No accessory muscle use. EXTR: No c/c/e NEURO Normal gait.  PSYCH: Normally interactive. Conversant. Not depressed or anxious appearing.  Calm demeanor.    Assessment and Plan: Leukopenia, unspecified type - Plan: CBC  Age-related osteoporosis without current pathological fracture  Repeat CBC today- her wbc runs on the lower side of normal See discussion re osteoporosis above, for the time being she declines treatment, will plan to rescan in one year   Signed Abbe Amsterdam, MD Received her labs, letter to pt   Results for orders placed or performed in visit on 02/05/18  CBC  Result Value Ref Range   WBC 3.3 (L) 4.0 - 10.5 K/uL   RBC 4.21 3.87 - 5.11 Mil/uL   Platelets 229.0 150.0 - 400.0 K/uL   Hemoglobin 12.3 12.0 - 15.0 g/dL   HCT 60.4 54.0 - 98.1 %   MCV 89.1 78.0 - 100.0 fl   MCHC 32.7  30.0 - 36.0 g/dL   RDW 19.1 47.8 - 29.5 %

## 2018-02-04 NOTE — Telephone Encounter (Signed)
I have called [facility], they are faxing over her report for OV tomorrow. Jasmine December, if you receive it you can bring it straight to me or Dr. Patsy Lager.

## 2018-02-05 ENCOUNTER — Encounter: Payer: Self-pay | Admitting: Family Medicine

## 2018-02-05 ENCOUNTER — Ambulatory Visit: Payer: Medicare Other | Admitting: Family Medicine

## 2018-02-05 VITALS — BP 130/88 | HR 56 | Temp 97.9°F | Resp 16 | Ht 65.0 in | Wt 125.0 lb

## 2018-02-05 DIAGNOSIS — M810 Age-related osteoporosis without current pathological fracture: Secondary | ICD-10-CM

## 2018-02-05 DIAGNOSIS — D72819 Decreased white blood cell count, unspecified: Secondary | ICD-10-CM

## 2018-02-05 LAB — CBC
HEMATOCRIT: 37.5 % (ref 36.0–46.0)
Hemoglobin: 12.3 g/dL (ref 12.0–15.0)
MCHC: 32.7 g/dL (ref 30.0–36.0)
MCV: 89.1 fl (ref 78.0–100.0)
Platelets: 229 10*3/uL (ref 150.0–400.0)
RBC: 4.21 Mil/uL (ref 3.87–5.11)
RDW: 13.6 % (ref 11.5–15.5)
WBC: 3.3 10*3/uL — ABNORMAL LOW (ref 4.0–10.5)

## 2018-02-05 NOTE — Patient Instructions (Addendum)
Let's plan to repeat a bone density in about one year and then we can think about treatment again In the meantime please get calcium from diet and supplements if needed- 1,200 mg per day is generally recommended I would also recommend vitamin D, about 2,000 IU daily   We will recheck your white cell count today and I'll be in touch with your report  Take care!

## 2018-02-06 ENCOUNTER — Telehealth: Payer: Self-pay | Admitting: *Deleted

## 2018-02-06 NOTE — Telephone Encounter (Signed)
Received Bone Density results from [facility]; forwarded to provider/SLS 10/31 

## 2018-02-06 NOTE — Telephone Encounter (Signed)
Conversation  (Newest Message First)  Me   01/08/18 1:15 PM  Note    Received Bone Density results from [facility]; forwarded to provider/SLS 10/02

## 2019-06-04 DIAGNOSIS — Z20828 Contact with and (suspected) exposure to other viral communicable diseases: Secondary | ICD-10-CM | POA: Diagnosis not present

## 2019-06-11 ENCOUNTER — Ambulatory Visit: Payer: Medicare PPO

## 2019-06-20 LAB — HM MAMMOGRAPHY

## 2019-06-24 ENCOUNTER — Telehealth: Payer: Self-pay

## 2019-06-24 ENCOUNTER — Ambulatory Visit: Payer: Medicare PPO | Admitting: Family Medicine

## 2019-06-24 ENCOUNTER — Other Ambulatory Visit: Payer: Self-pay

## 2019-06-24 ENCOUNTER — Emergency Department (HOSPITAL_COMMUNITY)
Admission: EM | Admit: 2019-06-24 | Discharge: 2019-06-24 | Disposition: A | Discharge status: Home / Self Care | Payer: Medicare PPO | Attending: Emergency Medicine | Admitting: Emergency Medicine

## 2019-06-24 ENCOUNTER — Encounter (HOSPITAL_COMMUNITY): Payer: Self-pay | Admitting: *Deleted

## 2019-06-24 ENCOUNTER — Emergency Department (HOSPITAL_COMMUNITY): Payer: Medicare PPO

## 2019-06-24 DIAGNOSIS — R202 Paresthesia of skin: Secondary | ICD-10-CM | POA: Diagnosis not present

## 2019-06-24 DIAGNOSIS — T50B95A Adverse effect of other viral vaccines, initial encounter: Secondary | ICD-10-CM | POA: Diagnosis not present

## 2019-06-24 DIAGNOSIS — T50905A Adverse effect of unspecified drugs, medicaments and biological substances, initial encounter: Secondary | ICD-10-CM | POA: Insufficient documentation

## 2019-06-24 DIAGNOSIS — I10 Essential (primary) hypertension: Secondary | ICD-10-CM | POA: Diagnosis not present

## 2019-06-24 DIAGNOSIS — T887XXA Unspecified adverse effect of drug or medicament, initial encounter: Secondary | ICD-10-CM

## 2019-06-24 DIAGNOSIS — R200 Anesthesia of skin: Secondary | ICD-10-CM

## 2019-06-24 LAB — BASIC METABOLIC PANEL
Anion gap: 7 (ref 5–15)
BUN: 15 mg/dL (ref 8–23)
CO2: 27 mmol/L (ref 22–32)
Calcium: 9.9 mg/dL (ref 8.9–10.3)
Chloride: 107 mmol/L (ref 98–111)
Creatinine, Ser: 0.79 mg/dL (ref 0.44–1.00)
GFR calc Af Amer: >60 mL/min (ref >60)
GFR calc non Af Amer: >60 mL/min (ref >60)
Glucose, Bld: 108 mg/dL — ABNORMAL HIGH (ref 70–99)
Potassium: 3.6 mmol/L (ref 3.5–5.1)
Sodium: 141 mmol/L (ref 135–145)

## 2019-06-24 LAB — CBC WITH DIFFERENTIAL/PLATELET
Abs Immature Granulocytes: 0.01 10*3/uL (ref 0.00–0.07)
Basophils Absolute: 0 10*3/uL (ref 0.0–0.1)
Basophils Relative: 0 %
Eosinophils Absolute: 0 10*3/uL (ref 0.0–0.5)
Eosinophils Relative: 0 %
HCT: 39.1 % (ref 36.0–46.0)
Hemoglobin: 12 g/dL (ref 12.0–15.0)
Immature Granulocytes: 0 %
Lymphocytes Relative: 29 %
Lymphs Abs: 1.3 10*3/uL (ref 0.7–4.0)
MCH: 28.6 pg (ref 26.0–34.0)
MCHC: 30.7 g/dL (ref 30.0–36.0)
MCV: 93.1 fL (ref 80.0–100.0)
Monocytes Absolute: 0.3 10*3/uL (ref 0.1–1.0)
Monocytes Relative: 7 %
Neutro Abs: 2.9 10*3/uL (ref 1.7–7.7)
Neutrophils Relative %: 64 %
Platelets: 235 10*3/uL (ref 150–400)
RBC: 4.2 MIL/uL (ref 3.87–5.11)
RDW: 13.6 % (ref 11.5–15.5)
WBC: 4.6 10*3/uL (ref 4.0–10.5)
nRBC: 0 % (ref 0.0–0.2)

## 2019-06-24 LAB — SEDIMENTATION RATE: Sed Rate: 5 mm/hr (ref 0–22)

## 2019-06-24 LAB — C-REACTIVE PROTEIN: CRP: 0.6 mg/dL (ref <1.0)

## 2019-06-24 MED ORDER — DIPHENHYDRAMINE HCL 25 MG PO CAPS: 25.0000 mg | ORAL_CAPSULE | Freq: Four times a day (QID) | ORAL | 0 refills | Status: DC | PRN

## 2019-06-24 MED ORDER — LORAZEPAM 1 MG PO TABS
2.0000 mg | ORAL_TABLET | Freq: Once | ORAL | Status: AC
  Administered 2019-06-24: 2 mg via ORAL
  Filled 2019-06-24: qty 2

## 2019-06-24 MED ORDER — GADOBUTROL 1 MMOL/ML IV SOLN
6.0000 mL | Freq: Once | INTRAVENOUS | Status: AC | PRN
  Administered 2019-06-24: 6 mL via INTRAVENOUS

## 2019-06-24 NOTE — ED Provider Notes (Addendum)
[place] DEPT Provider Note   CSN: [id number] Arrival date & time: 06/24/19  1040     History Chief Complaint  Patient presents with  . Numbness    Margaret Murphy is a 69 y.o. female.  HPI     69 year old female comes in a chief complaint of numbness. She has history of anxiety, hypertension.  Patient reports that she received her Pfizer vaccine on Monday.  Within 15 minutes she started having numbness to the left side of her face and also at the backside of her left leg.  Patient's symptoms are constant.  She denies any associated weakness, vision change, slurred speech, dizziness, headaches, neck pain.  Patient has no itching, rash, shortness of breath.  She has no history of allergic reaction to vaccination in the past. Patient called her PCP who advised that she come to the ER. There is no history of strokes. Past Medical History:  Diagnosis Date  . Anxiety   . Depression   . Hypertension     Patient Active Problem List   Diagnosis Date Noted  . Osteoporosis 01/08/2018  . Pre-diabetes 05/12/2015  . HTN (hypertension) 12/19/2011  . Depression with anxiety 12/19/2011  . Hypercholesteremia 12/19/2011  . History of urinary tract infection 12/19/2011    Past Surgical History:  Procedure Laterality Date  . CESAREAN SECTION     #1 1978  and #2 1989     OB History   No obstetric history on file.     Family History  Problem Relation Age of Onset  . Hypertension Mother        dx in 43's- not sure of age  . Diabetes Mother   . Hypertension Maternal Grandmother        does not know age of dx  . Glaucoma Father   . Hypertension Sister   . Diabetes Brother   . Cancer Maternal Aunt 78       Breast cancer  . Hypertension Sister   . Hypertension Maternal Uncle     Social History   Tobacco Use  . Smoking status: Never Smoker  . Smokeless tobacco: Never Used  Substance Use Topics  . Alcohol use: No  . Drug use: No     Home Medications Prior to Admission medications   Medication Sig Start Date End Date Taking? Authorizing Provider  aspirin 325 MG tablet Take 325 mg by mouth every 4 (four) hours as needed for mild pain, moderate pain, fever or headache. Reported on 05/11/2015    [provider]  Biotin 10000 MCG TABS Take by mouth.    [provider]  ciclopirox (LOPROX) 0.77 % cream Apply topically 2 (two) times daily. Use as needed for foot rash 01/01/18   Copland, Gay Filler, MD  diphenhydrAMINE (BENADRYL) 25 mg capsule Take 1 capsule (25 mg total) by mouth every 6 (six) hours as needed for itching. 06/24/19   Varney Biles, MD  ibuprofen (ADVIL,MOTRIN) 200 MG tablet Take 400 mg by mouth every 8 (eight) hours as needed for fever, headache, mild pain, moderate pain or cramping.    [provider]  Multiple Vitamins-Minerals (CENTRUM SILVER 50+WOMEN PO) Take by mouth.    [provider]  traMADol (ULTRAM) 50 MG tablet Take 1 tablet (50 mg total) by mouth every 6 (six) hours as needed. 04/19/15   Lajean Saver, MD  UNABLE TO FIND Med Name: Hempvana pain relief cream for knee pain    [provider]  Allergies    Diuretic [buchu-cornsilk-ch grass-hydran], Metoprolol, and Sulfonamide derivatives  Review of Systems   Review of Systems  Constitutional: Positive for activity change.  Skin: Negative for rash.  Allergic/Immunologic: Negative for environmental allergies and food allergies.  Neurological: Positive for numbness.  All other systems reviewed and are negative.   Physical Exam Updated Vital Signs BP (!) 168/76 (BP Location: Left Arm)   Pulse 69   Temp 98.4 F (36.9 C) (Oral)   Resp 16   Ht 5' 4.5" (1.638 m)   Wt 56.7 kg   SpO2 99%   BMI 21.12 kg/m   Physical Exam Vitals and nursing note reviewed.  Constitutional:      Appearance: She is well-developed.  HENT:     Head: Normocephalic and atraumatic.  Cardiovascular:     Rate and Rhythm:  Normal rate.  Pulmonary:     Effort: Pulmonary effort is normal.  Abdominal:     General: Bowel sounds are normal.  Musculoskeletal:     Cervical back: Normal range of motion and neck supple.  Skin:    General: Skin is warm and dry.  Neurological:     Mental Status: She is alert and oriented to person, place, and time.     Sensory: Sensory deficit present.     Motor: No weakness.     Gait: Gait normal.     Comments: Subjective numbness to left side of lower face, left posterior leg 4+/5 strength of the upper and lower extremities. Cerebellar exam did not reveal any dysmetria. Cranial nerves II through XII intact.     ED Results / Procedures / Treatments   Labs (all labs ordered are listed, but only abnormal results are displayed) Labs Reviewed  BASIC METABOLIC PANEL - Abnormal; Notable for the following components:      Result Value   Glucose, Bld 108 (*)    All other components within normal limits  CBC WITH DIFFERENTIAL/PLATELET  SEDIMENTATION RATE  C-REACTIVE PROTEIN    EKG None  Radiology MR Brain W and Wo Contrast  Result Date: 06/24/2019 CLINICAL DATA:  Left-sided numbness EXAM: MRI HEAD WITHOUT AND WITH CONTRAST TECHNIQUE: Multiplanar, multiecho pulse sequences of the brain and surrounding structures were obtained without and with intravenous contrast. CONTRAST:  3mL GADAVIST GADOBUTROL 1 MMOL/ML IV SOLN COMPARISON:  None. FINDINGS: Brain: There is no acute infarction or intracranial hemorrhage. There is no intracranial mass, mass effect, or edema. There is no hydrocephalus or extra-axial fluid collection. No abnormal enhancement. Vascular: Major vessel flow voids at the skull base are preserved. Skull and upper cervical spine: Normal marrow signal is preserved. Sinuses/Orbits: Paranasal sinuses are aerated. Orbits are unremarkable. Other: Sella is unremarkable.  Mastoid air cells are clear. IMPRESSION: No acute infarction, hemorrhage, or mass. Electronically Signed    By: Guadlupe Spanish M.D.   On: 06/24/2019 15:45    Procedures Procedures (including critical care time)  Medications Ordered in ED Medications  LORazepam (ATIVAN) tablet 2 mg (2 mg Oral Given 06/24/19 1406)  gadobutrol (GADAVIST) 1 MMOL/ML injection 6 mL (6 mLs Intravenous Contrast Given 06/24/19 1525)    ED Course  I have reviewed the triage vital signs and the nursing notes.  Pertinent labs & imaging results that were available during my care of the patient were reviewed by me and considered in my medical decision making (see chart for details).    MDM Rules/Calculators/A&P  69 year old female comes in a chief complaint of left-sided numbness. She reports that her symptoms started within 15 minutes of her first Pfizer vaccine dose. She has no other allergic reaction-like symptoms.  She has no history of strokes.  No other focal neurologic deficits besides left-sided facial and posterior leg numbness that has been constant and not progressing since the onset of the incident.  Differential diagnosis includes stroke versus MS versus side effect/hypersensitivity reaction to [employer] vaccination.  Reassessment: MRI is negative for any acute findings.  Results of the ED work-up discussed with the patient. She has no ascending symptoms therefore I doubt that there is a GBS component.  Symptoms have been present now for 3 days.  She has been advised to follow-up with PCP to decide on whether to get 2nd shot.  Final Clinical Impression(s) / ED Diagnoses Final diagnoses:  Numbness  Side effect of drug    Rx / DC Orders ED Discharge Orders         Ordered    diphenhydrAMINE (BENADRYL) 25 mg capsule  Every 6 hours PRN     06/24/19 1558              Derwood Kaplan, MD 06/24/19 1623

## 2019-06-24 NOTE — Discharge Instructions (Signed)
We saw the ER for numbness.  We did an MRI of your brain and it does not show any evidence of stroke or damage to your brain.  It is unclear why you are having the symptoms that you are.  It is possible that it could be related to [employer] vaccine.  Recommend that you start taking Benadryl. We recommend that you follow-up with your primary care doctor for further evaluation.  Please also consider downloading v-safe to report your symptoms.

## 2019-06-24 NOTE — ED Notes (Signed)
Pt transported to MRI with MRI Tech. 

## 2019-06-24 NOTE — Telephone Encounter (Signed)
Nurse triage line called stating that she received covid vaccine on Monday. Since this morning she started having left sided numbness of upper body, legs, and in tongue. She scheduled and appointment to see Dr. Patsy Lager this afternoon however Dr. Patsy Lager and I advised her to go to ER at [facility] or [place] to possibly have an MRI as our med center does not have mri today. She verbalized understanding.

## 2019-06-24 NOTE — ED Triage Notes (Signed)
Pfizer 1st dose on Monday, felt something different in left arm, progressed to numbness which continues. No deficits noted in triage, states she is able to use her hand and arm without problems

## 2019-06-26 ENCOUNTER — Other Ambulatory Visit: Payer: Self-pay

## 2019-06-29 ENCOUNTER — Other Ambulatory Visit: Payer: Self-pay

## 2019-06-29 ENCOUNTER — Encounter: Payer: Self-pay | Admitting: Family Medicine

## 2019-06-29 ENCOUNTER — Ambulatory Visit: Payer: Medicare PPO | Admitting: Family Medicine

## 2019-06-29 VITALS — BP 176/92 | HR 77 | Temp 96.4°F | Resp 16 | Ht 64.5 in | Wt 115.0 lb

## 2019-06-29 DIAGNOSIS — T50Z95A Adverse effect of other vaccines and biological substances, initial encounter: Secondary | ICD-10-CM

## 2019-06-29 DIAGNOSIS — R0602 Shortness of breath: Secondary | ICD-10-CM | POA: Diagnosis not present

## 2019-06-29 DIAGNOSIS — I10 Essential (primary) hypertension: Secondary | ICD-10-CM

## 2019-06-29 MED ORDER — AMLODIPINE BESYLATE 2.5 MG PO TABS: 2.5000 mg | ORAL_TABLET | Freq: Every day | ORAL | 3 refills | Status: DC

## 2019-06-29 NOTE — Patient Instructions (Addendum)
It was good to see you again today- I hope that your numbness and other symptoms continue to remit.  If they do not get better- or if getting worse- please alert me  We are going to put you on amlodipine 2.5 mg daily for your BP.  Please see me in about one month for a physical exam- we can follow-up on your BP and do complete labs at that time  Please let me know if your feeling of "needing to catch your breath" also does not continue to improve- sooner if getting worse!

## 2019-06-29 NOTE — Progress Notes (Signed)
[facility] at [facility] [address] [place], [place] [address] [phone]  Date:  06/29/2019   Name:  Margaret Murphy   DOB:  1950/10/04   MRN:  [id number]  PCP:  Pearline Cables, MD    Chief Complaint: ER follow up (numbness left side, follow up from vaccine)   History of Present Illness:  Margaret Murphy is a 69 y.o. very pleasant female patient who presents with the following:  Here today for a follow-up visit History of prediabetes, hypertension, hyperlipidemia She was seen in the ER on March 17 with concern of numbness of her face and leg; the symptoms may have started in relationship to receiving her COVID-19 vaccine Her ER work-up, including MRI was negative  She got her first dose of covid vaccine on 3/15- she noted an almost immediate reaction with sx of numbness on her left side.  She got her shot in the left deltoid She noted numbness in her left lip, and into her left foot and calf, and then into her left high  She feels like she has to "catch my breath now and the"n; this is also new since she had her vaccine.  No chest pain, she would not necessarily say she is short of breath  Her sx are gradually getting better- the lip numbness is improved, and the toe numbness is somewhat improved No weakness on her left side She took some benadryl which made her feel groggy but otherwise she is not fatigued   No localized reaction to the vaccine- no itching, hives or rash on her arm  She has history of borderline BP but we have not been treating her with antihypertensive mediation  No wheezing noted    Patient Active Problem List   Diagnosis Date Noted  . Osteoporosis 01/08/2018  . Pre-diabetes 05/12/2015  . HTN (hypertension) 12/19/2011  . Depression with anxiety 12/19/2011  . Hypercholesteremia 12/19/2011  . History of urinary tract infection 12/19/2011    Past Medical History:  Diagnosis Date  . Anxiety   .  Depression   . Hypertension     Past Surgical History:  Procedure Laterality Date  . CESAREAN SECTION     #1 1978  and #2 1989    Social History   Tobacco Use  . Smoking status: Never Smoker  . Smokeless tobacco: Never Used  Substance Use Topics  . Alcohol use: No  . Drug use: No    Family History  Problem Relation Age of Onset  . Hypertension Mother        dx in 87's- not sure of age  . Diabetes Mother   . Hypertension Maternal Grandmother        does not know age of dx  . Glaucoma Father   . Hypertension Sister   . Diabetes Brother   . Cancer Maternal Aunt 101       Breast cancer  . Hypertension Sister   . Hypertension Maternal Uncle     Allergies  Allergen Reactions  . Diuretic [Buchu-Cornsilk-Ch Grass-Hydran] Other (See Comments)    cramps  . Metoprolol Nausea And Vomiting  . Sulfonamide Derivatives Other (See Comments)    GI UPSET    Medication list has been reviewed and updated.  Current Outpatient Medications on File Prior to Visit  Medication Sig Dispense Refill  . aspirin 325 MG tablet Take 325 mg by mouth every 4 (four) hours as needed for mild pain, moderate  pain, fever or headache. Reported on 05/11/2015    . Biotin 10000 MCG TABS Take by mouth.    . ciclopirox (LOPROX) 0.77 % cream Apply topically 2 (two) times daily. Use as needed for foot rash 30 g 1  . diphenhydrAMINE (BENADRYL) 25 mg capsule Take 1 capsule (25 mg total) by mouth every 6 (six) hours as needed for itching. 20 capsule 0  . ibuprofen (ADVIL,MOTRIN) 200 MG tablet Take 400 mg by mouth every 8 (eight) hours as needed for fever, headache, mild pain, moderate pain or cramping.    . Multiple Vitamins-Minerals (CENTRUM SILVER 50+WOMEN PO) Take by mouth.    Marland Kitchen UNABLE TO FIND Med Name: Hempvana pain relief cream for knee pain     No current facility-administered medications on file prior to visit.    Review of Systems:  As per HPI- otherwise negative.   Physical Examination: Vitals:    06/29/19 1407  BP: (!) 176/92  Pulse: 77  Resp: 16  Temp: (!) 96.4 F (35.8 C)  SpO2: 100%   Vitals:   06/29/19 1407  Weight: 115 lb (52.2 kg)  Height: 5' 4.5" (1.638 m)   Body mass index is 19.43 kg/m. Ideal Body Weight: Weight in (lb) to have BMI = 25: 147.6  GEN: no acute distress.  Slim build, looks well HEENT: Atraumatic, Normocephalic.   Bilateral TM wnl, oropharynx normal.  PEERL,EOMI.   Ears and Nose: No external deformity. CV: RRR, No M/G/R. No JVD. No thrill. No extra heart sounds. PULM: CTA B, no wheezes, crackles, rhonchi. No retractions. No resp. distress. No accessory muscle use. ABD: S, NT, ND, +BS. No rebound. No HSM. EXTR: No c/c/e PSYCH: Normally interactive. Conversant.  Normal strength, sensation, DTR of all extremities.  Normal range of motion of her face  BP Readings from Last 3 Encounters:  06/29/19 (!) 176/92  06/24/19 (!) 146/82  02/05/18 130/88   EKG: c/w 2014 no acute change noted.  Normal sinus rhythm with rate of 70   Assessment and Plan: Shortness of breath  Hypertension, unspecified type - Plan: EKG 12-Lead, amLODipine (NORVASC) 2.5 MG tablet  Adverse effect of vaccine, initial encounter  Here today with a possible vaccine reaction-she noted almost immediate onset of numbness and tingling on her left side after having received her COVID-19 vaccine.  She was seen in the ER last week for further evaluation, MRI of her brain was negative.  She also noted an intermittent feeling of needing to catch her breath Today she notes that her symptoms are gradually improving I did get an EKG today due to possible shortness of breath, this is reassuring Her blood pressure is elevated today, she has used amlodipine in the past but not currently.  We will have her start back on amlodipine 2.5 mg daily She plans to see me in 1 month for a physical and blood pressure check I have asked her to alert me if her symptoms do not continue to gradually improve,  sooner if worse This visit occurred during the SARS-CoV-2 public health emergency.  Safety protocols were in place, including screening questions prior to the visit, additional usage of staff PPE, and extensive cleaning of exam room while observing appropriate contact time as indicated for disinfecting solutions.    Signed Lamar Blinks, MD

## 2019-07-15 ENCOUNTER — Encounter: Payer: Medicare PPO | Admitting: Family Medicine

## 2019-08-17 NOTE — Progress Notes (Addendum)
[facility] [address], [address] [place], [place] [address] [phone]  Date:  08/19/2019   Name:  Margaret Murphy   DOB:  [birth year]   MRN:  [id number]  PCP:  Pearline Cables, MD    Chief Complaint: Annual Exam   History of Present Illness:  Margaret Murphy is a 69 y.o. very pleasant female patient who presents with the following:  Patient here today for routine physical History of hypertension, prediabetes, hyperlipidemia, osteoporosis, depression Last seen by myself in March of this year after she had an episode of numbness of her face and leg-possible COVID-19 vaccine reaction ER work-up including MRI was negative She notes that she still may have some numbness of her face and leg- this may come and go.  She does feel like this is overall getting better.  Offered to have her see neurology- she declines for now, will observe as she does feel like it is getting better Blood pressure was elevated last visit I had her start back on amlodipine 2.5 mg.  She feels like it makes her tired however- too tired to drive the next day if she takes in the evening  Shingles vaccine; she declines for now Pneumonia series; declines for now  Mammogram due Colonoscopy due next year DEXA 2019, can update soon Not currently treated for osteoporosis  Her home BP has been running about 120/70- 80s  Patient Active Problem List   Diagnosis Date Noted  . Osteoporosis 01/08/2018  . Pre-diabetes 05/12/2015  . HTN (hypertension) 12/19/2011  . Depression with anxiety 12/19/2011  . Hypercholesteremia 12/19/2011  . History of urinary tract infection 12/19/2011    Past Medical History:  Diagnosis Date  . Anxiety   . Depression   . Hypertension     Past Surgical History:  Procedure Laterality Date  . CESAREAN SECTION     #1 1978  and #2 1989    Social History   Tobacco Use  . Smoking status: Never Smoker  . Smokeless tobacco:  Never Used  Substance Use Topics  . Alcohol use: No  . Drug use: No    Family History  Problem Relation Age of Onset  . Hypertension Mother        dx in 50's- not sure of age  . Diabetes Mother   . Hypertension Maternal Grandmother        does not know age of dx  . Glaucoma Father   . Hypertension Sister   . Diabetes Brother   . Cancer Maternal Aunt 66       Breast cancer  . Hypertension Sister   . Hypertension Maternal Uncle     Allergies  Allergen Reactions  . Diuretic [Buchu-Cornsilk-Ch Grass-Hydran] Other (See Comments)    cramps  . Metoprolol Nausea And Vomiting  . Sulfonamide Derivatives Other (See Comments)    GI UPSET    Medication list has been reviewed and updated.  Current Outpatient Medications on File Prior to Visit  Medication Sig Dispense Refill  . aspirin 325 MG tablet Take 325 mg by mouth every 4 (four) hours as needed for mild pain, moderate pain, fever or headache. Reported on 05/11/2015    . Biotin 10175 MCG TABS Take by mouth.    . diphenhydrAMINE (BENADRYL) 25 mg capsule Take 1 capsule (25 mg total) by mouth every 6 (six) hours as needed for itching. 20 capsule 0  . ibuprofen (ADVIL,MOTRIN) 200 MG tablet Take 400  mg by mouth every 8 (eight) hours as needed for fever, headache, mild pain, moderate pain or cramping.    . Multiple Vitamins-Minerals (CENTRUM SILVER 50+WOMEN PO) Take by mouth.    Marland Kitchen UNABLE TO FIND Med Name: Hempvana pain relief cream for knee pain     No current facility-administered medications on file prior to visit.    Review of Systems:  As per HPI- otherwise negative.   Physical Examination: Vitals:   08/19/19 0916  BP: (!) 171/73  Pulse: 65  Resp: 17  Temp: 97.9 F (36.6 C)  SpO2: 100%   Vitals:   08/19/19 0916  Weight: 113 lb (51.3 kg)  Height: 5' 4.5" (1.638 m)   Body mass index is 19.1 kg/m. Ideal Body Weight: Weight in (lb) to have BMI = 25: 147.6  GEN: no acute distress.  Slender build, looks well HEENT:  Atraumatic, Normocephalic.   Bilateral TM wnl, oropharynx normal.  PEERL,EOMI.   Ears and Nose: No external deformity. CV: RRR, No M/G/R. No JVD. No thrill. No extra heart sounds. PULM: CTA B, no wheezes, crackles, rhonchi. No retractions. No resp. distress. No accessory muscle use. ABD: S, NT, ND, +BS. No rebound. No HSM. EXTR: No c/c/e PSYCH: Normally interactive. Conversant.   Blood pressure elevated today, patient notes that she has been dealing with some family issues and was on the phone, which actually caused her to get lost on the way to the clinic today.  She is somewhat stressed Assessment and Plan: Physical exam  Hypercholesteremia - Plan: Lipid panel  Elevated hemoglobin A1c - Plan: Comprehensive metabolic panel, Hemoglobin A1c  Tinea pedis of both feet - Plan: ciclopirox (LOPROX) 0.77 % cream  IBS (irritable bowel syndrome) - Plan: hyoscyamine (LEVSIN) 0.125 MG tablet  Hypertension, unspecified type - Plan: losartan (COZAAR) 25 MG tablet  Age-related osteoporosis without current pathological fracture - Plan: DG Bone Density  Estrogen deficiency - Plan: DG Bone Density   Here today for a physical exam.  Discussed diet, exercise, tobacco avoidance. She uses hycosamine on occasion for IBS, refilled for her today Patient notes difficulty with feeling tired on amlodipine.  Will DC this medication change her to losartan 25 mg Ordered bone density, reminded to do mammogram this fall  Moderate medical decision making today  This visit occurred during the SARS-CoV-2 public health emergency.  Safety protocols were in place, including screening questions prior to the visit, additional usage of staff PPE, and extensive cleaning of exam room while observing appropriate contact time as indicated for disinfecting solutions.    Signed Abbe Amsterdam, MD  Received her labs as below, letter to patient A1c is stable Results for orders placed or performed in visit on 08/19/19   Comprehensive metabolic panel  Result Value Ref Range   Sodium 139 135 - 145 mEq/L   Potassium 4.2 3.5 - 5.1 mEq/L   Chloride 105 96 - 112 mEq/L   CO2 29 19 - 32 mEq/L   Glucose, Bld 92 70 - 99 mg/dL   BUN 20 6 - 23 mg/dL   Creatinine, Ser 1.61 0.40 - 1.20 mg/dL   Total Bilirubin 0.6 0.2 - 1.2 mg/dL   Alkaline Phosphatase 36 (L) 39 - 117 U/L   AST 19 0 - 37 U/L   ALT 12 0 - 35 U/L   Total Protein 6.9 6.0 - 8.3 g/dL   Albumin 4.2 3.5 - 5.2 g/dL   GFR 09.60 >45.40 mL/min   Calcium 10.0 8.4 - 10.5 mg/dL  Lipid panel  Result Value Ref Range   Cholesterol 252 (H) 0 - 200 mg/dL   Triglycerides 55.0 0.0 - 149.0 mg/dL   HDL 81.00 >39.00 mg/dL   VLDL 11.0 0.0 - 40.0 mg/dL   LDL Cholesterol 160 (H) 0 - 99 mg/dL   Total CHOL/HDL Ratio 3    NonHDL 171.28   Hemoglobin A1c  Result Value Ref Range   Hgb A1c MFr Bld 5.7 4.6 - 6.5 %    The 10-year ASCVD risk score Mikey Bussing DC Jr., et al., 2013) is: 17.8%   Values used to calculate the score:     Age: 27 years     Sex: Female     Is Non-Hispanic African American: Yes     Diabetic: No     Tobacco smoker: No     Systolic Blood Pressure: 118 mmHg     Is BP treated: Yes     HDL Cholesterol: 81 mg/dL     Total Cholesterol: 252 mg/dL

## 2019-08-17 NOTE — Patient Instructions (Signed)
It was great to see you again today, I will be in touch with your labs as soon as possible I ordered a bone density for you- please schedule with your mammogram in September at [facility]  We will change you from amlodipine to losartan for BP control- please let me know how this works for you   Health Maintenance After Age 69 After age 62, you are at a higher risk for certain long-term diseases and infections as well as injuries from falls. Falls are a major cause of broken bones and head injuries in people who are older than age 37. Getting regular preventive care can help to keep you healthy and well. Preventive care includes getting regular testing and making lifestyle changes as recommended by your health care provider. Talk with your health care provider about:  Which screenings and tests you should have. A screening is a test that checks for a disease when you have no symptoms.  A diet and exercise plan that is right for you. What should I know about screenings and tests to prevent falls? Screening and testing are the best ways to find a health problem early. Early diagnosis and treatment give you the best chance of managing medical conditions that are common after age 63. Certain conditions and lifestyle choices may make you more likely to have a fall. Your health care provider may recommend:  Regular vision checks. Poor vision and conditions such as cataracts can make you more likely to have a fall. If you wear glasses, make sure to get your prescription updated if your vision changes.  Medicine review. Work with your health care provider to regularly review all of the medicines you are taking, including over-the-counter medicines. Ask your health care provider about any side effects that may make you more likely to have a fall. Tell your health care provider if any medicines that you take make you feel dizzy or sleepy.  Osteoporosis screening. Osteoporosis is a condition that causes the bones  to get weaker. This can make the bones weak and cause them to break more easily.  Blood pressure screening. Blood pressure changes and medicines to control blood pressure can make you feel dizzy.  Strength and balance checks. Your health care provider may recommend certain tests to check your strength and balance while standing, walking, or changing positions.  Foot health exam. Foot pain and numbness, as well as not wearing proper footwear, can make you more likely to have a fall.  Depression screening. You may be more likely to have a fall if you have a fear of falling, feel emotionally low, or feel unable to do activities that you used to do.  Alcohol use screening. Using too much alcohol can affect your balance and may make you more likely to have a fall. What actions can I take to lower my risk of falls? General instructions  Talk with your health care provider about your risks for falling. Tell your health care provider if: ? You fall. Be sure to tell your health care provider about all falls, even ones that seem minor. ? You feel dizzy, sleepy, or off-balance.  Take over-the-counter and prescription medicines only as told by your health care provider. These include any supplements.  Eat a healthy diet and maintain a healthy weight. A healthy diet includes low-fat dairy products, low-fat (lean) meats, and fiber from whole grains, beans, and lots of fruits and vegetables. Home safety  Remove any tripping hazards, such as rugs, cords, and clutter.  Install safety equipment such as grab bars in bathrooms and safety rails on stairs.  Keep rooms and walkways well-lit. Activity   Follow a regular exercise program to stay fit. This will help you maintain your balance. Ask your health care provider what types of exercise are appropriate for you.  If you need a cane or walker, use it as recommended by your health care provider.  Wear supportive shoes that have nonskid  soles. Lifestyle  Do not drink alcohol if your health care provider tells you not to drink.  If you drink alcohol, limit how much you have: ? 0-1 drink a day for women. ? 0-2 drinks a day for men.  Be aware of how much alcohol is in your drink. In the [place] one drink equals one typical bottle of beer (12 oz), one-half glass of wine (5 oz), or one shot of hard liquor (1 oz).  Do not use any products that contain nicotine or tobacco, such as cigarettes and e-cigarettes. If you need help quitting, ask your health care provider. Summary  Having a healthy lifestyle and getting preventive care can help to protect your health and wellness after age 52.  Screening and testing are the best way to find a health problem early and help you avoid having a fall. Early diagnosis and treatment give you the best chance for managing medical conditions that are more common for people who are older than age 37.  Falls are a major cause of broken bones and head injuries in people who are older than age 21. Take precautions to prevent a fall at home.  Work with your health care provider to learn what changes you can make to improve your health and wellness and to prevent falls. This information is not intended to replace advice given to you by your health care provider. Make sure you discuss any questions you have with your health care provider. Document Revised: 07/17/2018 Document Reviewed: 02/06/2017 Elsevier Patient Education  2020 [employer].

## 2019-08-19 ENCOUNTER — Encounter: Payer: Self-pay | Admitting: Family Medicine

## 2019-08-19 ENCOUNTER — Ambulatory Visit (INDEPENDENT_AMBULATORY_CARE_PROVIDER_SITE_OTHER): Payer: Medicare PPO | Admitting: Family Medicine

## 2019-08-19 ENCOUNTER — Other Ambulatory Visit: Payer: Self-pay

## 2019-08-19 VITALS — BP 150/75 | HR 65 | Temp 97.9°F | Resp 17 | Ht 64.5 in | Wt 113.0 lb

## 2019-08-19 DIAGNOSIS — K589 Irritable bowel syndrome without diarrhea: Secondary | ICD-10-CM

## 2019-08-19 DIAGNOSIS — M810 Age-related osteoporosis without current pathological fracture: Secondary | ICD-10-CM

## 2019-08-19 DIAGNOSIS — Z0000 Encounter for general adult medical examination without abnormal findings: Secondary | ICD-10-CM | POA: Diagnosis not present

## 2019-08-19 DIAGNOSIS — I10 Essential (primary) hypertension: Secondary | ICD-10-CM | POA: Diagnosis not present

## 2019-08-19 DIAGNOSIS — E2839 Other primary ovarian failure: Secondary | ICD-10-CM

## 2019-08-19 DIAGNOSIS — B353 Tinea pedis: Secondary | ICD-10-CM

## 2019-08-19 DIAGNOSIS — E7800 Pure hypercholesterolemia, unspecified: Secondary | ICD-10-CM | POA: Diagnosis not present

## 2019-08-19 DIAGNOSIS — R7309 Other abnormal glucose: Secondary | ICD-10-CM

## 2019-08-19 LAB — COMPREHENSIVE METABOLIC PANEL
ALT: 12 U/L (ref 0–35)
AST: 19 U/L (ref 0–37)
Albumin: 4.2 g/dL (ref 3.5–5.2)
Alkaline Phosphatase: 36 U/L — ABNORMAL LOW (ref 39–117)
BUN: 20 mg/dL (ref 6–23)
CO2: 29 mEq/L (ref 19–32)
Calcium: 10 mg/dL (ref 8.4–10.5)
Chloride: 105 mEq/L (ref 96–112)
Creatinine, Ser: 0.75 mg/dL (ref 0.40–1.20)
GFR: 92.84 mL/min (ref >60.00)
Glucose, Bld: 92 mg/dL (ref 70–99)
Potassium: 4.2 mEq/L (ref 3.5–5.1)
Sodium: 139 mEq/L (ref 135–145)
Total Bilirubin: 0.6 mg/dL (ref 0.2–1.2)
Total Protein: 6.9 g/dL (ref 6.0–8.3)

## 2019-08-19 LAB — LIPID PANEL
Cholesterol: 252 mg/dL — ABNORMAL HIGH (ref 0–200)
HDL: 81 mg/dL (ref >39.00)
LDL Cholesterol: 160 mg/dL — ABNORMAL HIGH (ref 0–99)
NonHDL: 171.28
Total CHOL/HDL Ratio: 3
Triglycerides: 55 mg/dL (ref 0.0–149.0)
VLDL: 11 mg/dL (ref 0.0–40.0)

## 2019-08-19 LAB — HEMOGLOBIN A1C: Hgb A1c MFr Bld: 5.7 % (ref 4.6–6.5)

## 2019-08-19 MED ORDER — HYOSCYAMINE SULFATE 0.125 MG PO TABS: 0.1250 mg | ORAL_TABLET | ORAL | 2 refills | Status: DC | PRN

## 2019-08-19 MED ORDER — LOSARTAN POTASSIUM 25 MG PO TABS: 25.0000 mg | ORAL_TABLET | Freq: Every day | ORAL | 3 refills | Status: DC

## 2019-08-19 MED ORDER — CICLOPIROX OLAMINE 0.77 % EX CREA: TOPICAL_CREAM | Freq: Two times a day (BID) | CUTANEOUS | 1 refills | Status: DC

## 2019-08-26 ENCOUNTER — Telehealth: Payer: Self-pay | Admitting: Family Medicine

## 2019-08-26 DIAGNOSIS — I10 Essential (primary) hypertension: Secondary | ICD-10-CM

## 2019-08-26 MED ORDER — AMLODIPINE BESYLATE 2.5 MG PO TABS: 2.5000 mg | ORAL_TABLET | Freq: Every day | ORAL | 3 refills | Status: DC

## 2019-08-26 NOTE — Telephone Encounter (Signed)
Ok- sent in rx for amlodipine to her pharmacy  Meds ordered this encounter  Medications  . amLODipine (NORVASC) 2.5 MG tablet    Sig: Take 1 tablet (2.5 mg total) by mouth daily.    Dispense:  90 tablet    Refill:  3

## 2019-08-26 NOTE — Telephone Encounter (Signed)
CallerReida Murphy Call Back # [phone]  Subject: Medication Reaction  Patient states recently prescribed new medication for Blood Pressure. (Lorsatrin)  Patient complains that medication is giving her Headaches, Cold Symptoms, Urinary Issues   Patient states that she last medication, (Amlodipine) only made her sleepy. Patient states that she would wether be sleepy.  Patient offered appointment, patient states she just had a cpe.

## 2019-08-26 NOTE — Telephone Encounter (Signed)
Patient would like to restart Amlodipine rather than side effects of losartan.

## 2019-11-20 ENCOUNTER — Encounter: Payer: Self-pay | Admitting: Family Medicine

## 2019-11-20 DIAGNOSIS — M85852 Other specified disorders of bone density and structure, left thigh: Secondary | ICD-10-CM | POA: Diagnosis not present

## 2019-11-20 DIAGNOSIS — M85851 Other specified disorders of bone density and structure, right thigh: Secondary | ICD-10-CM | POA: Diagnosis not present

## 2019-11-20 DIAGNOSIS — Z780 Asymptomatic menopausal state: Secondary | ICD-10-CM | POA: Diagnosis not present

## 2019-11-20 DIAGNOSIS — Z1231 Encounter for screening mammogram for malignant neoplasm of breast: Secondary | ICD-10-CM | POA: Diagnosis not present

## 2019-11-20 DIAGNOSIS — M810 Age-related osteoporosis without current pathological fracture: Secondary | ICD-10-CM | POA: Diagnosis not present

## 2019-11-20 LAB — HM DEXA SCAN

## 2019-11-23 ENCOUNTER — Encounter: Payer: Self-pay | Admitting: Family Medicine

## 2019-11-25 ENCOUNTER — Encounter: Payer: Self-pay | Admitting: Family Medicine

## 2019-11-30 ENCOUNTER — Encounter: Payer: Self-pay | Admitting: Family Medicine

## 2019-12-01 ENCOUNTER — Encounter: Payer: Self-pay | Admitting: Family Medicine

## 2019-12-01 NOTE — Progress Notes (Signed)
[facility] [address] [place], [place] [address] [phone]  Date:  12/03/2019   Name:  Margaret Murphy   DOB:  09-30-1950   MRN:  [id number]  PCP:  Pearline Cables, MD    Chief Complaint: Osteoporosis (discuss dx) and Blood Pressure Check (discuss medicatio, low readings)   History of Present Illness:  Margaret Murphy is a 69 y.o. very pleasant female patient who presents with the following:  Patient with history of hypertension, hyperlipidemia, prediabetes Here today to discuss osteoporosis Last seen by myself in May of this year Recent bone density scan from August 13 showed osteoporosis, she is interested in discussing treatment options  COVID-19 series- she just got one dose due to reaction that occurred when she got her first dose.  She had a relatively serious reaction, does not plan to have another dose which is reasonable in her case  Labs done in May, normal calcium and renal function at that time  She will check her BP at home and it looks fine- she stopped taking amlodipine as her BP was too low  She brings in a record of average blood pressure readings over the last several months, indeed her blood pressure is under good control without amlodipine Patient Active Problem List   Diagnosis Date Noted   Osteoporosis 01/08/2018   Pre-diabetes 05/12/2015   HTN (hypertension) 12/19/2011   Depression with anxiety 12/19/2011   Hypercholesteremia 12/19/2011   History of urinary tract infection 12/19/2011    Past Medical History:  Diagnosis Date   Anxiety    Depression    Hypertension     Past Surgical History:  Procedure Laterality Date   CESAREAN SECTION     #1 1978  and #2 1989    Social History   Tobacco Use   Smoking status: Never Smoker   Smokeless tobacco: Never Used  Substance Use Topics   Alcohol use: No   Drug use: No    Family History  Problem Relation Age of  Onset   Hypertension Mother        dx in 62's- not sure of age   Diabetes Mother    Hypertension Maternal Grandmother        does not know age of dx   Glaucoma Father    Hypertension Sister    Diabetes Brother    Cancer Maternal Aunt 22       Breast cancer   Hypertension Sister    Hypertension Maternal Uncle     Allergies  Allergen Reactions   Diuretic [Buchu-Cornsilk-Ch Grass-Hydran] Other (See Comments)    cramps   Metoprolol Nausea And Vomiting   Sulfonamide Derivatives Other (See Comments)    GI UPSET    Medication list has been reviewed and updated.  Current Outpatient Medications on File Prior to Visit  Medication Sig Dispense Refill   Biotin 29528 MCG TABS Take by mouth.     ciclopirox (LOPROX) 0.77 % cream Apply topically 2 (two) times daily. Use as needed for foot rash 30 g 1   hyoscyamine (LEVSIN) 0.125 MG tablet Take 1 tablet (0.125 mg total) by mouth as needed. Max 1.5 mg in 24 hours 60 tablet 2   ibuprofen (ADVIL,MOTRIN) 200 MG tablet Take 400 mg by mouth every 8 (eight) hours as needed for fever, headache, mild pain, moderate pain or cramping.     Multiple Vitamins-Minerals (CENTRUM SILVER 50+WOMEN PO) Take by mouth.  UNABLE TO FIND Med Name: Hempvana pain relief cream for knee pain     No current facility-administered medications on file prior to visit.    Review of Systems:  As per HPI- otherwise negative.   Physical Examination: Vitals:   12/03/19 1015 12/03/19 1030  BP: (!) 168/98 130/80  Pulse: 90   Resp: 16   SpO2: 97%    Vitals:   12/03/19 1015  Weight: 121 lb (54.9 kg)  Height: 5' 4.5" (1.638 m)   Body mass index is 20.45 kg/m. Ideal Body Weight: Weight in (lb) to have BMI = 25: 147.6  GEN: no acute distress.  Slim build, looks well HEENT: Atraumatic, Normocephalic.  Ears and Nose: No external deformity. CV: RRR, No M/G/R. No JVD. No thrill. No extra heart sounds. PULM: CTA B, no wheezes, crackles, rhonchi. No  retractions. No resp. distress. No accessory muscle use. EXTR: No c/c/e PSYCH: Normally interactive. Conversant.    Assessment and Plan: Age-related osteoporosis without current pathological fracture - Plan: alendronate (FOSAMAX) 70 MG tablet  Chronic pain of left knee - Plan: Ambulatory referral to Orthopedic Surgery   Patient here today to discuss osteoporosis.  She is interested in trying Fosamax, we discussed most common side effects.  Prescription sent to her drugstore, she will let me know if any problems.  Otherwise plan to repeat bone density in 2 years  Patient also notes she has some intermittent knee pain.  She got a steroid shot in her knee over a decade ago, it was helpful for quite a long time.  She is interested in possibly repeating this procedure.  I referred her to orthopedics  This visit occurred during the SARS-CoV-2 public health emergency.  Safety protocols were in place, including screening questions prior to the visit, additional usage of staff PPE, and extensive cleaning of exam room while observing appropriate contact time as indicated for disinfecting solutions.    Signed Abbe Amsterdam, MD

## 2019-12-03 ENCOUNTER — Ambulatory Visit: Payer: Medicare PPO | Admitting: Family Medicine

## 2019-12-03 ENCOUNTER — Encounter: Payer: Self-pay | Admitting: Family Medicine

## 2019-12-03 ENCOUNTER — Other Ambulatory Visit: Payer: Self-pay

## 2019-12-03 VITALS — BP 130/80 | HR 90 | Resp 16 | Ht 64.5 in | Wt 121.0 lb

## 2019-12-03 DIAGNOSIS — M810 Age-related osteoporosis without current pathological fracture: Secondary | ICD-10-CM | POA: Diagnosis not present

## 2019-12-03 DIAGNOSIS — M25562 Pain in left knee: Secondary | ICD-10-CM | POA: Diagnosis not present

## 2019-12-03 DIAGNOSIS — G8929 Other chronic pain: Secondary | ICD-10-CM | POA: Diagnosis not present

## 2019-12-03 MED ORDER — ALENDRONATE SODIUM 70 MG PO TABS: 70.0000 mg | ORAL_TABLET | ORAL | 11 refills | Status: DC

## 2019-12-03 NOTE — Patient Instructions (Addendum)
Good to see you again today-  We will plan to recheck your bone density in about 2 years  I will send you to see Dr Althea Charon in [place]- at [facility] ortho on [address]- to look at your knee

## 2019-12-25 DIAGNOSIS — M25562 Pain in left knee: Secondary | ICD-10-CM | POA: Diagnosis not present

## 2020-03-14 ENCOUNTER — Telehealth: Payer: Self-pay | Admitting: Family Medicine

## 2020-03-14 NOTE — Telephone Encounter (Signed)
Spoke with patient. She has not taken last dose of fosamax due to side effects. She would like to explore alternate options. I did mention to her Prolia. She states she will research medication and call me back at the end of the week to let me know if she is interested. If she is is this ok to switch (if insurance covers) ?

## 2020-03-14 NOTE — Telephone Encounter (Signed)
Yes, probably is fine for this patient if preferred

## 2020-03-14 NOTE — Telephone Encounter (Signed)
Caller: Margaret Murphy back number: [phone]  Patient states she is having bad abdominal pain. Patient states she thinks is caused by Fosamax and would like medication to be switch. She would like to speak to someone to choose a better medication.

## 2020-03-21 ENCOUNTER — Ambulatory Visit: Payer: Medicare PPO | Admitting: Family Medicine

## 2020-03-21 ENCOUNTER — Other Ambulatory Visit: Payer: Self-pay

## 2020-03-21 ENCOUNTER — Encounter: Payer: Self-pay | Admitting: Family Medicine

## 2020-03-21 VITALS — BP 115/80 | HR 88 | Resp 16 | Ht 64.5 in | Wt 121.0 lb

## 2020-03-21 DIAGNOSIS — M810 Age-related osteoporosis without current pathological fracture: Secondary | ICD-10-CM

## 2020-03-21 MED ORDER — IBANDRONATE SODIUM 150 MG PO TABS: 150.0000 mg | ORAL_TABLET | ORAL | 0 refills | Status: DC

## 2020-03-21 NOTE — Patient Instructions (Signed)
Great to see you today!  We will try once a month Boniva for your osteoporosis let me know how this works for you!

## 2020-03-21 NOTE — Progress Notes (Signed)
[facility] [address], [address] [place], [place] [address] [phone]  Date:  03/21/2020   Name:  Margaret Murphy   DOB:  1950/10/27   MRN:  [id number]  PCP:  Margaret Cables, MD    Chief Complaint: Osteoporosis   History of Present Illness:  Margaret Murphy is a 69 y.o. very pleasant female patient who presents with the following:  Pt here today for a med check - we diagnosed osteoporosis and started her on fosamax in August.  She has some potential side effects from this medication and is interested in other options History of Prediabetes, HTN  She started taking Fosamax about 3 months ago due to osteoporosis seen on bone density scan taken November 20, 2019.  For the 3 months of therapy she seemed to do fine, then she developed sharp pains in her belly and some scratchy throat, and also some bladder symptoms (which she describes as doing that her urine stream was weaker than normal)-the symptoms occurred after she took a routine dose of Fosamax.  They did eventually resolve, she stopped taking Fosamax at that time and symptoms have not come back She did not have any hives, swelling, rash, angioedema, or other symptoms of true allergic reaction  Patient Active Problem List   Diagnosis Date Noted  . Osteoporosis 01/08/2018  . Pre-diabetes 05/12/2015  . HTN (hypertension) 12/19/2011  . Depression with anxiety 12/19/2011  . Hypercholesteremia 12/19/2011  . History of urinary tract infection 12/19/2011    Past Medical History:  Diagnosis Date  . Anxiety   . Depression   . Hypertension     Past Surgical History:  Procedure Laterality Date  . CESAREAN SECTION     #1 1978  and #2 1989    Social History   Tobacco Use  . Smoking status: Never Smoker  . Smokeless tobacco: Never Used  Substance Use Topics  . Alcohol use: No  . Drug use: No    Family History  Problem Relation Age of Onset  . Hypertension  Mother        dx in 24's- not sure of age  . Diabetes Mother   . Hypertension Maternal Grandmother        does not know age of dx  . Glaucoma Father   . Hypertension Sister   . Diabetes Brother   . Cancer Maternal Aunt 65       Breast cancer  . Hypertension Sister   . Hypertension Maternal Uncle     Allergies  Allergen Reactions  . Diuretic [Buchu-Cornsilk-Ch Grass-Hydran] Other (See Comments)    cramps  . Fosamax [Alendronate] Other (See Comments)    Sharp abdominal pain, slowed urine stream, sore throat   . Metoprolol Nausea And Vomiting  . Sulfonamide Derivatives Other (See Comments)    GI UPSET    Medication list has been reviewed and updated.  Current Outpatient Medications on File Prior to Visit  Medication Sig Dispense Refill  . Biotin 09381 MCG TABS Take by mouth.    . ciclopirox (LOPROX) 0.77 % cream Apply topically 2 (two) times daily. Use as needed for foot rash 30 g 1  . hyoscyamine (LEVSIN) 0.125 MG tablet Take 1 tablet (0.125 mg total) by mouth as needed. Max 1.5 mg in 24 hours 60 tablet 2  . ibuprofen (ADVIL,MOTRIN) 200 MG tablet Take 400 mg by mouth every 8 (eight) hours as needed for fever, headache, mild pain, moderate  pain or cramping.    . Multiple Vitamins-Minerals (CENTRUM SILVER 50+WOMEN PO) Take by mouth.    Marland Kitchen UNABLE TO FIND Med Name: Hempvana pain relief cream for knee pain     No current facility-administered medications on file prior to visit.    Review of Systems:  As per HPI- otherwise negative.   Physical Examination: Vitals:   03/21/20 1128 03/21/20 1155  BP: (!) 156/94 115/80  Pulse: 88   Resp: 16   SpO2: 99%    Vitals:   03/21/20 1128  Weight: 121 lb (54.9 kg)  Height: 5' 4.5" (1.638 m)   Body mass index is 20.45 kg/m. Ideal Body Weight: Weight in (lb) to have BMI = 25: 147.6  GEN: No acute distress; alert,appropriate. PULM: Breathing comfortably in no respiratory distress PSYCH: Normally interactive.  Petite build,  looks well  Assessment and Plan: Age-related osteoporosis without current pathological fracture - Plan: ibandronate (BONIVA) 150 MG tablet  Patient with history of osteoporosis.  As above, she developed possible intolerance symptoms after using Fosamax for a few months We discussed her options in detail.  I explained that she certainly does not have to treat her osteoporosis if she does not wish to.  She is concerned about future fracture risk and would like to try another option.  We talked about Prolia but she would first like to try a different oral bisphosphonate and see if this might be better tolerated  We discussed options here, decide on once a month Boniva-she is aware that since this is a monthly formula, any side effects could last longer.  However, this may be offset by the advantages of less frequent dosing  She will let me know how she does with the new note.  If also not tolerated will reconsider Prolia  I also encouraged her to continue vitamin D and calcium supplementation as well as weightbearing exercise  Signed Abbe Amsterdam, MD

## 2020-09-19 IMAGING — MR MR HEAD WO/W CM
12 series · 48 of 48 positions shown · IV contrast (gadavist)
Comparison: None.

CLINICAL DATA: Left-sided numbness

EXAM:
MRI HEAD WITHOUT AND WITH CONTRAST
TECHNIQUE: Multiplanar, multiecho pulse sequences of the brain and surrounding
structures were obtained without and with intravenous contrast.
CONTRAST:  6mL GADAVIST GADOBUTROL 1 MMOL/ML IV SOLN

[Series 5: T1 · sagittal · 5.0mm · 0.75mm/px · 2 of 24 slices shown (1 of 2)]
[im 1/24]
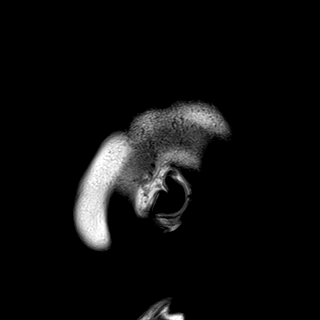
[im 24/24]
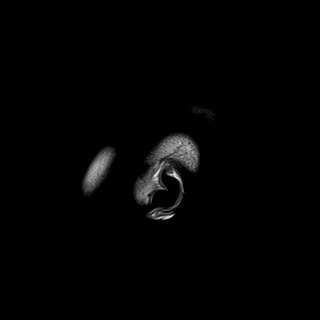

[Series 6: T2 · axial · 5.0mm · 0.62mm/px · z∈[-74,+86]mm · 2 of 26 slices shown]
[im 1/26]
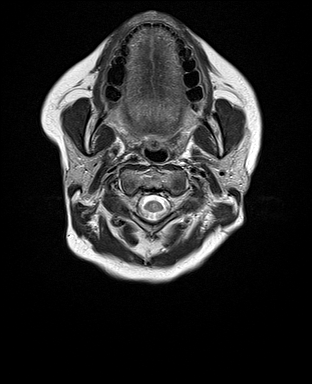
[im 26/26]
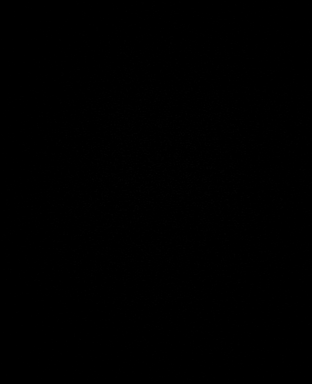

[Series 7: DWI · axial · 3.0mm · 1.36mm/px · z∈[-60,+67]mm · 6 of 88 slices shown (1 of 4)]
[im 1/88]
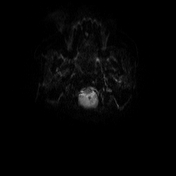
[im 18/88]
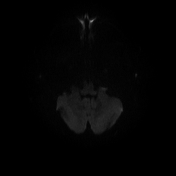
[im 35/88]
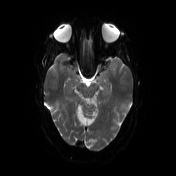
[im 53/88]
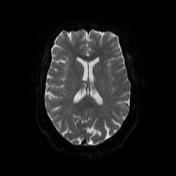
[im 70/88]
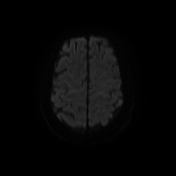
[im 88/88]
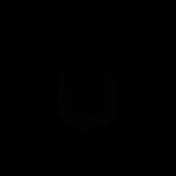

[Series 8: DWI · axial · 3.0mm · 1.36mm/px · z∈[-60,+67]mm · 3 of 44 slices shown (2 of 4)]
[im 1/44]
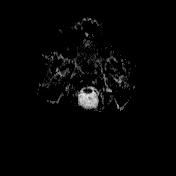
[im 22/44]
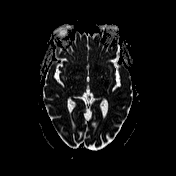
[im 44/44]
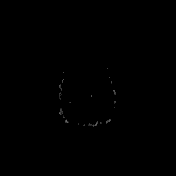

[Series 10: swi_images · axial · 3.0mm · 0.75mm/px · z∈[-87,+99]mm · 4 of 64 slices shown]
[im 1/64]
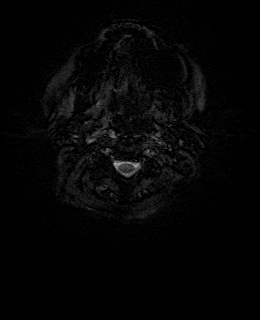
[im 22/64]
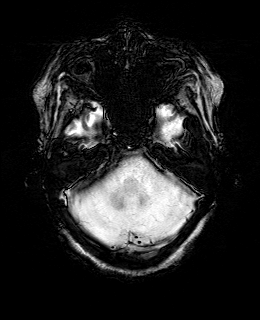
[im 43/64]
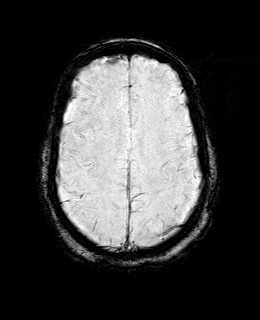
[im 64/64]
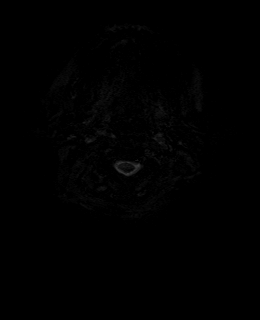

[Series 11: FLAIR · axial · 3.0mm · 0.75mm/px · z∈[-58,+69]mm · 3 of 44 slices shown]
[im 1/44]
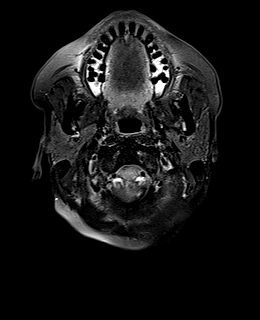
[im 22/44]
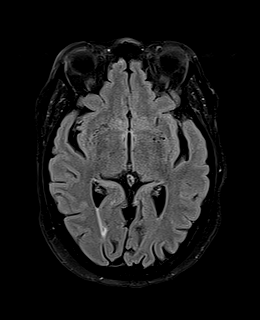
[im 44/44]
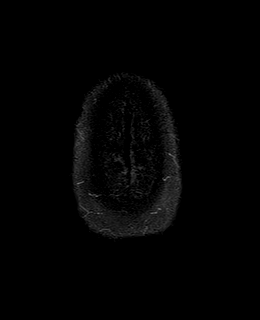

[Series 12: T1 · axial · 1.0mm · 0.94mm/px · z∈[-61,+81]mm · 9 of 144 slices shown (2 of 2)]
[im 1/144]
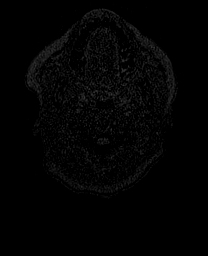
[im 18/144]
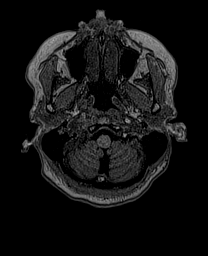
[im 36/144]
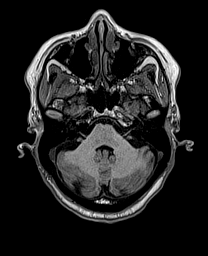
[im 54/144]
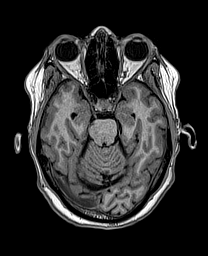
[im 72/144]
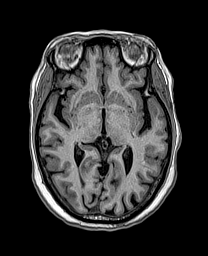
[im 90/144]
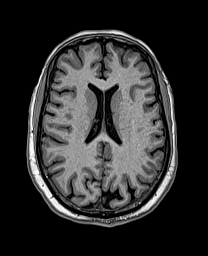
[im 108/144]
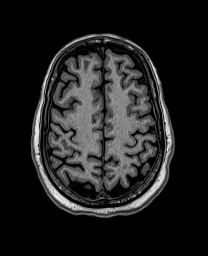
[im 126/144]
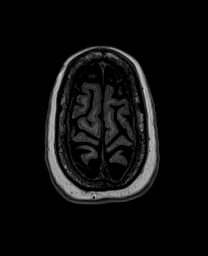
[im 144/144]
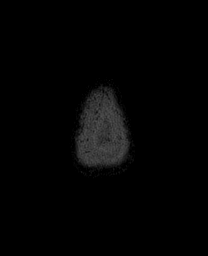

[Series 13: DWI · coronal · 5.0mm · 1.31mm/px · 4 of 64 slices shown (3 of 4)]
[im 1/64]
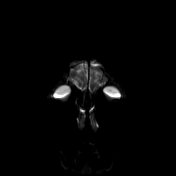
[im 22/64]
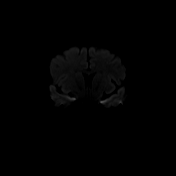
[im 43/64]
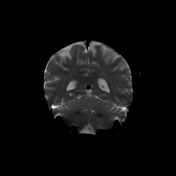
[im 64/64]
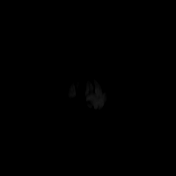

[Series 14: DWI · coronal · 5.0mm · 1.31mm/px · 2 of 32 slices shown (4 of 4)]
[im 1/32]
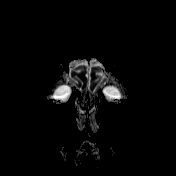
[im 32/32]
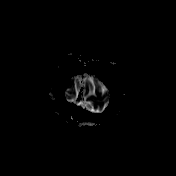

[Series 15: T2 post-contrast · coronal · 5.0mm · 0.57mm/px · 2 of 29 slices shown]
[im 1/29]
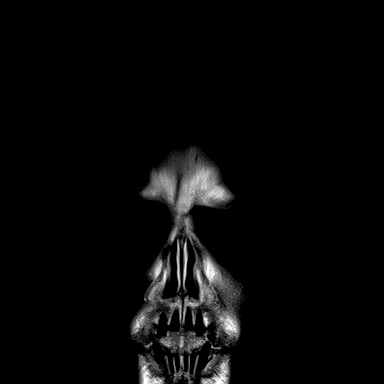
[im 29/29]
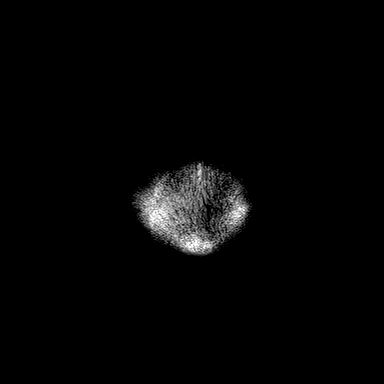

[Series 16: T1 post-contrast · axial · 1.0mm · 0.94mm/px · z∈[-65,+76]mm · 9 of 144 slices shown (1 of 2)]
[im 1/144]
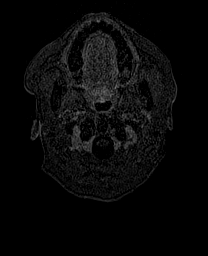
[im 18/144]
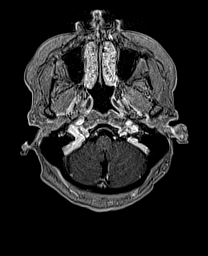
[im 36/144]
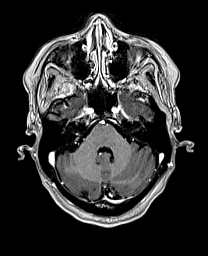
[im 54/144]
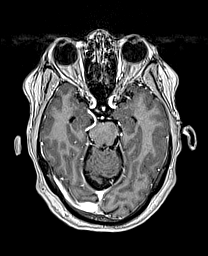
[im 72/144]
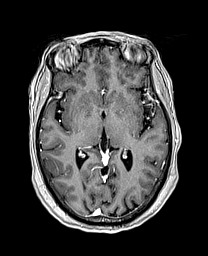
[im 90/144]
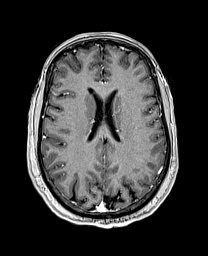
[im 108/144]
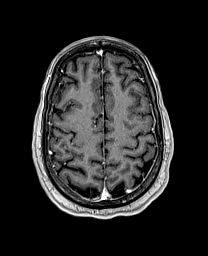
[im 126/144]
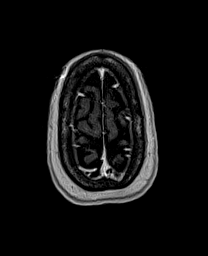
[im 144/144]
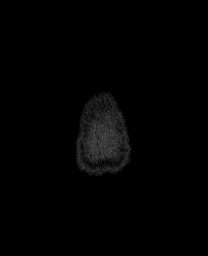

[Series 17: T1 post-contrast · coronal · 5.0mm · 0.43mm/px · 2 of 29 slices shown (2 of 2)]
[im 1/29]
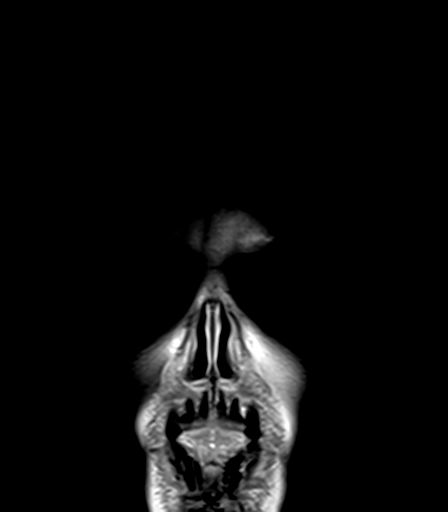
[im 29/29]
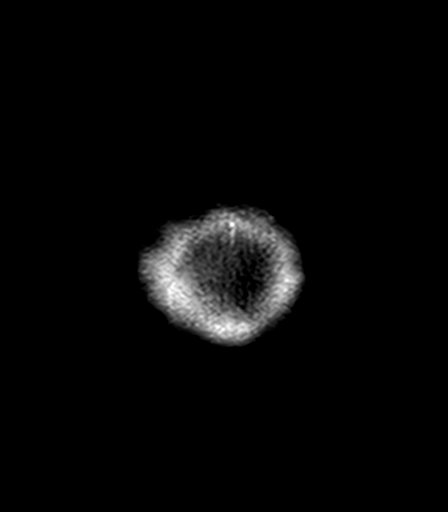

[48 of 48 positions shown; findings below may reference images not displayed]

FINDINGS: Brain: There is no acute infarction or intracranial hemorrhage.
There is no intracranial mass, mass effect, or edema. There is no
hydrocephalus or extra-axial fluid collection. No abnormal
enhancement.

Vascular: Major vessel flow voids at the skull base are preserved.

Skull and upper cervical spine: Normal marrow signal is preserved.

Sinuses/Orbits: Paranasal sinuses are aerated. Orbits are
unremarkable.

Other: Sella is unremarkable.  Mastoid air cells are clear.
IMPRESSION: No acute infarction, hemorrhage, or mass.

## 2020-10-30 ENCOUNTER — Encounter: Payer: Self-pay | Admitting: Gastroenterology

## 2021-01-23 ENCOUNTER — Telehealth: Payer: Self-pay | Admitting: Family Medicine

## 2021-01-23 NOTE — Telephone Encounter (Signed)
Left message for patient to call back and schedule Medicare Annual Wellness Visit (AWV) in office.  ° °If not able to come in office, please offer to do virtually or by telephone.  Left office number and my jabber #[phone]. ° °Due for AWVI ° °Please schedule at anytime with Nurse Health Advisor. °  °

## 2021-02-11 NOTE — Patient Instructions (Addendum)
Good to see you today! I will be in touch with your labs  If not done already I would recommend that you have the shingles series -shingrix We will get you a cologuard kit sent to your home for colon cancer screening  Let me know how you do with [facility]

## 2021-02-11 NOTE — Progress Notes (Addendum)
[facility] at [facility] [address] [place], [place] [address] [phone]  Date:  02/13/2021   Name:  Margaret Murphy   DOB:  11-11-1950   MRN:  [id number]  PCP:  Pearline Cables, MD    Chief Complaint: No chief complaint on file.   History of Present Illness:  Margaret Murphy is a 70 y.o. very pleasant female patient who presents with the following:  Pt seen today for a CPE Last seen by myself in December  History of prediabetes and HTN, osteoporosis  We tried fosamax but had SE- we discussed our options and she wanted to try different bisphosphonate before going to Prolia.  I prescribed Boniva- However she never actually took it as she was concerned about SE  She is motivated to be treated for osteoporosis she would like to try Boniva if I will prescribe it again Her BP may fluctuate some but systolic is generally less than about 140  Labs are due- last done 5/21, fasting today  Mammo 8/21- scheduled for this week  Pap- never had an abnormal pap  Dexa - 8/21 Colon- 2012, cologuard 2017- discussed screening today.  She would like to do cologuard Shingrix- discussed with her  Pneumonia - done at her [facility]  Covid booster- updated booster done, she will bring in her card  Flu - done today  Patient Active Problem List   Diagnosis Date Noted   Osteoporosis 01/08/2018   Pre-diabetes 05/12/2015   HTN (hypertension) 12/19/2011   Depression with anxiety 12/19/2011   Hypercholesteremia 12/19/2011   History of urinary tract infection 12/19/2011    Past Medical History:  Diagnosis Date   Anxiety    Depression    Hypertension     Past Surgical History:  Procedure Laterality Date   CESAREAN SECTION     #1 1978  and #2 1989    Social History   Tobacco Use   Smoking status: Never   Smokeless tobacco: Never  Substance Use Topics   Alcohol use: No   Drug use: No    Family History  Problem Relation Age  of Onset   Hypertension Mother        dx in 52's- not sure of age   Diabetes Mother    Hypertension Maternal Grandmother        does not know age of dx   Glaucoma Father    Hypertension Sister    Diabetes Brother    Cancer Maternal Aunt 55       Breast cancer   Hypertension Sister    Hypertension Maternal Uncle     Allergies  Allergen Reactions   Diuretic [Buchu-Cornsilk-Ch Grass-Hydran] Other (See Comments)    cramps   Fosamax [Alendronate] Other (See Comments)    Sharp abdominal pain, slowed urine stream, sore throat    Metoprolol Nausea And Vomiting   Sulfonamide Derivatives Other (See Comments)    GI UPSET    Medication list has been reviewed and updated.  Current Outpatient Medications on File Prior to Visit  Medication Sig Dispense Refill   Biotin 13244 MCG TABS Take by mouth.     ciclopirox (LOPROX) 0.77 % cream Apply topically 2 (two) times daily. Use as needed for foot rash 30 g 1   hyoscyamine (LEVSIN) 0.125 MG tablet Take 1 tablet (0.125 mg total) by mouth as needed. Max 1.5 mg in 24 hours 60 tablet 2   ibuprofen (ADVIL,MOTRIN) 200 MG  tablet Take 400 mg by mouth every 8 (eight) hours as needed for fever, headache, mild pain, moderate pain or cramping.     Multiple Vitamins-Minerals (CENTRUM SILVER 50+WOMEN PO) Take by mouth.     UNABLE TO FIND Med Name: Hempvana pain relief cream for knee pain     No current facility-administered medications on file prior to visit.    Review of Systems:  As per HPI- otherwise negative.   Physical Examination: Vitals:   02/13/21 1041 02/13/21 1104  BP: (!) 154/70 140/90  Pulse: 73   Resp: 16   Temp: 98.4 F (36.9 C)   SpO2: 100%    Vitals:   02/13/21 1041  Weight: 117 lb 3.2 oz (53.2 kg)  Height: 5\' 5"  (1.651 m)   Body mass index is 19.5 kg/m. Ideal Body Weight: Weight in (lb) to have BMI = 25: 149.9  GEN: no acute distress.  Slim build, looks well HEENT: Atraumatic, Normocephalic.  Bilateral TM wnl,  oropharynx normal.  PEERL,EOMI.   Ears and Nose: No external deformity. CV: RRR, No M/G/R. No JVD. No thrill. No extra heart sounds. PULM: CTA B, no wheezes, crackles, rhonchi. No retractions. No resp. distress. No accessory muscle use. ABD: S, NT, ND, +BS. No rebound. No HSM. EXTR: No c/c/e PSYCH: Normally interactive. Conversant.    Assessment and Plan: Physical exam  Age-related osteoporosis without current pathological fracture - Plan: ibandronate (BONIVA) 150 MG tablet  Elevated hemoglobin A1c - Plan: Hemoglobin A1c  Hypertension, unspecified type - Plan: CBC, Comprehensive metabolic panel  Irritable bowel syndrome, unspecified type  Hypercholesteremia - Plan: Lipid panel  Fatigue, unspecified type - Plan: TSH, VITAMIN D 25 Hydroxy (Vit-D Deficiency, Fractures)  Screening for colon cancer - Plan: Cologuard  Immunization due - Plan: Flu Vaccine QUAD High Dose(Fluad)  Physical exam today Will plan further follow- up pending labs. Flu shot given Discussed treatment for osteoporosis.  She notes abdominal pain with Fosamax, however no angioedema, no hives or rash.  She would like to try Boniva-she will let me know how this works for her I asked her to keep an eye on her blood pressures at home and let me know if going higher  Signed , MD  Addendum 11/8, received her labs as below.  Letter to patient Note mild anemia.  She had a negative colonoscopy 10 years ago and Cologuard 5 years ago, also negative Results for orders placed or performed in visit on 02/13/21  CBC  Result Value Ref Range   WBC 3.0 (L) 4.0 - 10.5 K/uL   RBC 3.84 (L) 3.87 - 5.11 Mil/uL   Platelets 247.0 150.0 - 400.0 K/uL   Hemoglobin 11.2 (L) 12.0 - 15.0 g/dL   HCT 13/07/22 (L) 16.1 - 09.6 %   MCV 89.7 78.0 - 100.0 fl   MCHC 32.5 30.0 - 36.0 g/dL   RDW 04.5 40.9 - 81.1 %  Comprehensive metabolic panel  Result Value Ref Range   Sodium 142 135 - 145 mEq/L   Potassium 4.0 3.5 - 5.1 mEq/L    Chloride 107 96 - 112 mEq/L   CO2 29 19 - 32 mEq/L   Glucose, Bld 89 70 - 99 mg/dL   BUN 19 6 - 23 mg/dL   Creatinine, Ser 91.4 0.40 - 1.20 mg/dL   Total Bilirubin 0.5 0.2 - 1.2 mg/dL   Alkaline Phosphatase 41 39 - 117 U/L   AST 20 0 - 37 U/L   ALT 14 0 - 35 U/L  Total Protein 6.7 6.0 - 8.3 g/dL   Albumin 4.2 3.5 - 5.2 g/dL   GFR 06.00 >45.99 mL/min   Calcium 9.5 8.4 - 10.5 mg/dL  Hemoglobin H7S  Result Value Ref Range   Hgb A1c MFr Bld 5.8 4.6 - 6.5 %  Lipid panel  Result Value Ref Range   Cholesterol 213 (H) 0 - 200 mg/dL   Triglycerides 14.2 0.0 - 149.0 mg/dL   HDL 39.53 >20.23 mg/dL   VLDL 9.2 0.0 - 34.3 mg/dL   LDL Cholesterol 568 (H) 0 - 99 mg/dL   Total CHOL/HDL Ratio 3    NonHDL 135.84   TSH  Result Value Ref Range   TSH 1.16 0.35 - 5.50 uIU/mL  VITAMIN D 25 Hydroxy (Vit-D Deficiency, Fractures)  Result Value Ref Range   VITD 54.44 30.00 - 100.00 ng/mL

## 2021-02-13 ENCOUNTER — Ambulatory Visit (INDEPENDENT_AMBULATORY_CARE_PROVIDER_SITE_OTHER): Payer: Medicare PPO | Admitting: Family Medicine

## 2021-02-13 ENCOUNTER — Other Ambulatory Visit: Payer: Self-pay | Admitting: Family Medicine

## 2021-02-13 ENCOUNTER — Other Ambulatory Visit: Payer: Self-pay

## 2021-02-13 ENCOUNTER — Encounter: Payer: Self-pay | Admitting: Family Medicine

## 2021-02-13 VITALS — BP 140/90 | HR 73 | Temp 98.4°F | Resp 16 | Ht 65.0 in | Wt 117.2 lb

## 2021-02-13 DIAGNOSIS — M810 Age-related osteoporosis without current pathological fracture: Secondary | ICD-10-CM | POA: Diagnosis not present

## 2021-02-13 DIAGNOSIS — Z23 Encounter for immunization: Secondary | ICD-10-CM | POA: Diagnosis not present

## 2021-02-13 DIAGNOSIS — Z1211 Encounter for screening for malignant neoplasm of colon: Secondary | ICD-10-CM | POA: Diagnosis not present

## 2021-02-13 DIAGNOSIS — E7800 Pure hypercholesterolemia, unspecified: Secondary | ICD-10-CM

## 2021-02-13 DIAGNOSIS — R7309 Other abnormal glucose: Secondary | ICD-10-CM

## 2021-02-13 DIAGNOSIS — Z0000 Encounter for general adult medical examination without abnormal findings: Secondary | ICD-10-CM | POA: Diagnosis not present

## 2021-02-13 DIAGNOSIS — R5383 Other fatigue: Secondary | ICD-10-CM | POA: Diagnosis not present

## 2021-02-13 DIAGNOSIS — D72819 Decreased white blood cell count, unspecified: Secondary | ICD-10-CM

## 2021-02-13 DIAGNOSIS — I10 Essential (primary) hypertension: Secondary | ICD-10-CM

## 2021-02-13 DIAGNOSIS — K589 Irritable bowel syndrome without diarrhea: Secondary | ICD-10-CM

## 2021-02-13 LAB — CBC
HCT: 34.4 % — ABNORMAL LOW (ref 36.0–46.0)
Hemoglobin: 11.2 g/dL — ABNORMAL LOW (ref 12.0–15.0)
MCHC: 32.5 g/dL (ref 30.0–36.0)
MCV: 89.7 fl (ref 78.0–100.0)
Platelets: 247 10*3/uL (ref 150.0–400.0)
RBC: 3.84 Mil/uL — ABNORMAL LOW (ref 3.87–5.11)
RDW: 13.7 % (ref 11.5–15.5)
WBC: 3 10*3/uL — ABNORMAL LOW (ref 4.0–10.5)

## 2021-02-13 LAB — LIPID PANEL
Cholesterol: 213 mg/dL — ABNORMAL HIGH (ref 0–200)
HDL: 77.2 mg/dL (ref >39.00)
LDL Cholesterol: 127 mg/dL — ABNORMAL HIGH (ref 0–99)
NonHDL: 135.84
Total CHOL/HDL Ratio: 3
Triglycerides: 46 mg/dL (ref 0.0–149.0)
VLDL: 9.2 mg/dL (ref 0.0–40.0)

## 2021-02-13 LAB — COMPREHENSIVE METABOLIC PANEL
ALT: 14 U/L (ref 0–35)
AST: 20 U/L (ref 0–37)
Albumin: 4.2 g/dL (ref 3.5–5.2)
Alkaline Phosphatase: 41 U/L (ref 39–117)
BUN: 19 mg/dL (ref 6–23)
CO2: 29 mEq/L (ref 19–32)
Calcium: 9.5 mg/dL (ref 8.4–10.5)
Chloride: 107 mEq/L (ref 96–112)
Creatinine, Ser: 0.81 mg/dL (ref 0.40–1.20)
GFR: 73.77 mL/min (ref >60.00)
Glucose, Bld: 89 mg/dL (ref 70–99)
Potassium: 4 mEq/L (ref 3.5–5.1)
Sodium: 142 mEq/L (ref 135–145)
Total Bilirubin: 0.5 mg/dL (ref 0.2–1.2)
Total Protein: 6.7 g/dL (ref 6.0–8.3)

## 2021-02-13 LAB — TSH: TSH: 1.16 u[IU]/mL (ref 0.35–5.50)

## 2021-02-13 LAB — HEMOGLOBIN A1C: Hgb A1c MFr Bld: 5.8 % (ref 4.6–6.5)

## 2021-02-13 LAB — VITAMIN D 25 HYDROXY (VIT D DEFICIENCY, FRACTURES): VITD: 54.44 ng/mL (ref 30.00–100.00)

## 2021-02-13 MED ORDER — IBANDRONATE SODIUM 150 MG PO TABS: 150.0000 mg | ORAL_TABLET | ORAL | 0 refills | Status: DC

## 2021-02-14 NOTE — Addendum Note (Signed)
Addended by: Abbe Amsterdam C on: 02/14/2021 06:24 AM   Modules accepted: Orders

## 2021-02-17 DIAGNOSIS — Z1231 Encounter for screening mammogram for malignant neoplasm of breast: Secondary | ICD-10-CM | POA: Diagnosis not present

## 2021-02-17 LAB — HM MAMMOGRAPHY

## 2021-02-24 DIAGNOSIS — Z1211 Encounter for screening for malignant neoplasm of colon: Secondary | ICD-10-CM | POA: Diagnosis not present

## 2021-03-02 LAB — COLOGUARD: COLOGUARD: NEGATIVE

## 2021-03-03 ENCOUNTER — Encounter: Payer: Self-pay | Admitting: Family Medicine

## 2021-03-20 ENCOUNTER — Other Ambulatory Visit: Payer: Self-pay

## 2021-03-20 ENCOUNTER — Telehealth: Payer: Self-pay | Admitting: Family Medicine

## 2021-03-20 DIAGNOSIS — M810 Age-related osteoporosis without current pathological fracture: Secondary | ICD-10-CM

## 2021-03-20 MED ORDER — IBANDRONATE SODIUM 150 MG PO TABS: 150.0000 mg | ORAL_TABLET | ORAL | 2 refills | Status: DC

## 2021-03-20 NOTE — Telephone Encounter (Signed)
Rx refilled.

## 2021-03-20 NOTE — Telephone Encounter (Signed)
Pt was wondering if she could get a 2 month refill.  Medication: ibandronate (BONIVA) 150 MG tablet   Has the patient contacted their pharmacy? Yes.    Preferred Pharmacy: [facility] [address] [place] - [address] AT [facility]  [address], [place] [address]  Phone:  [phone]  Fax:  [phone]

## 2021-05-01 ENCOUNTER — Telehealth: Payer: Self-pay | Admitting: Family Medicine

## 2021-05-01 NOTE — Telephone Encounter (Signed)
Left message for patient to call back and schedule Medicare Annual Wellness Visit (AWV) in office.  ° °If not able to come in office, please offer to do virtually or by telephone.  Left office number and my jabber #[phone]. ° °Due for AWVI ° °Please schedule at anytime with Nurse Health Advisor. °  °

## 2021-05-17 ENCOUNTER — Telehealth: Payer: Self-pay | Admitting: Family Medicine

## 2021-05-17 ENCOUNTER — Other Ambulatory Visit: Payer: Self-pay

## 2021-05-17 DIAGNOSIS — M810 Age-related osteoporosis without current pathological fracture: Secondary | ICD-10-CM

## 2021-05-17 MED ORDER — IBANDRONATE SODIUM 150 MG PO TABS: 150.0000 mg | ORAL_TABLET | ORAL | 2 refills | Status: DC

## 2021-05-17 NOTE — Telephone Encounter (Signed)
Refill sent.

## 2021-05-17 NOTE — Telephone Encounter (Signed)
Pt was wondering if she could get her medication extended, and if dr. Patsy Lager wanted to extend the medication until her cpe. Please advise.   Medication: ibandronate (BONIVA) 150 MG tablet   Has the patient contacted their pharmacy? No.   Preferred Pharmacy: [facility] [address] [place] - [address] AT [facility]  [address], [place] [address]  Phone:  [phone]  Fax:  [phone]

## 2021-05-19 ENCOUNTER — Ambulatory Visit (INDEPENDENT_AMBULATORY_CARE_PROVIDER_SITE_OTHER): Payer: Medicare PPO

## 2021-05-19 VITALS — Ht 65.0 in | Wt 120.0 lb

## 2021-05-19 DIAGNOSIS — Z0000 Encounter for general adult medical examination without abnormal findings: Secondary | ICD-10-CM | POA: Diagnosis not present

## 2021-05-19 NOTE — Progress Notes (Signed)
Subjective:   Margaret Murphy is a 71 y.o. female who presents for an Initial Medicare Annual Wellness Visit.  I connected with Reniyah today by telephone and verified that I am speaking with the correct person using two identifiers. Location patient: home Location provider: work Persons participating in the virtual visit: patient, Engineer, civil (consulting).    I discussed the limitations, risks, security and privacy concerns of performing an evaluation and management service by telephone and the availability of in person appointments. I also discussed with the patient that there may be a patient responsible charge related to this service. The patient expressed understanding and verbally consented to this telephonic visit.    Interactive audio and video telecommunications were attempted between this provider and patient, however failed, due to patient having technical difficulties OR patient did not have access to video capability.  We continued and completed visit with audio only.  Some vital signs may be absent or patient reported.   Time Spent with patient on telephone encounter: 30 minutes   Review of Systems     Cardiac Risk Factors include: advanced age (>82men, >3 women);hypertension;dyslipidemia     Objective:    Today's Vitals   05/19/21 0823  Weight: 120 lb (54.4 kg)  Height: 5\' 5"  (1.651 m)   Body mass index is 19.97 kg/m.  Advanced Directives 05/19/2021 06/24/2019 04/19/2015  Does Patient Have a Medical Advance Directive? No No No  Would patient like information on creating a medical advance directive? Yes (MAU/Ambulatory/Procedural Areas - Information given) - No - patient declined information    Current Medications (verified) Outpatient Encounter Medications as of 05/19/2021  Medication Sig   Biotin 07/17/2021 MCG TABS Take by mouth.   ciclopirox (LOPROX) 0.77 % cream Apply topically 2 (two) times daily. Use as needed for foot rash   hyoscyamine (LEVSIN) 0.125 MG tablet Take 1  tablet (0.125 mg total) by mouth as needed. Max 1.5 mg in 24 hours   ibandronate (BONIVA) 150 MG tablet Take 1 tablet (150 mg total) by mouth every 30 (thirty) days. Take in the morning with a full glass of water, on an empty stomach, and do not take anything else by mouth or lie down for the next 30 min.   ibuprofen (ADVIL,MOTRIN) 200 MG tablet Take 400 mg by mouth every 8 (eight) hours as needed for fever, headache, mild pain, moderate pain or cramping.   Multiple Vitamins-Minerals (CENTRUM SILVER 50+WOMEN PO) Take by mouth.   UNABLE TO FIND Med Name: Hempvana pain relief cream for knee pain   No facility-administered encounter medications on file as of 05/19/2021.    Allergies (verified) Diuretic [buchu-cornsilk-ch grass-hydran], Fosamax [alendronate], Metoprolol, and Sulfonamide derivatives   History: Past Medical History:  Diagnosis Date   Anxiety    Depression    Hypertension    Past Surgical History:  Procedure Laterality Date   CESAREAN SECTION     #1 28  and #2 1989   Family History  Problem Relation Age of Onset   Hypertension Mother        dx in 45's- not sure of age   Diabetes Mother    Hypertension Maternal Grandmother        does not know age of dx   Glaucoma Father    Hypertension Sister    Diabetes Brother    Cancer Maternal Aunt 80       Breast cancer   Hypertension Sister    Hypertension Maternal Uncle    Social History  Socioeconomic History   Marital status: Divorced    Spouse name: Not on file   Number of children: Not on file   Years of education: Not on file   Highest education level: Not on file  Occupational History   Occupation: retired  Tobacco Use   Smoking status: Never   Smokeless tobacco: Never  Substance and Sexual Activity   Alcohol use: No   Drug use: No   Sexual activity: Yes    Birth control/protection: None  Other Topics Concern   Not on file  Social History Narrative   Married. Education: College/other.   Social  Determinants of Health   Financial Resource Strain: Low Risk    Difficulty of Paying Living Expenses: Not hard at all  Food Insecurity: No Food Insecurity   Worried About Programme researcher, broadcasting/film/video in the Last Year: Never true   Ran Out of Food in the Last Year: Never true  Transportation Needs: No Transportation Needs   Lack of Transportation (Medical): No   Lack of Transportation (Non-Medical): No  Physical Activity: Insufficiently Active   Days of Exercise per Week: 4 days   Minutes of Exercise per Session: 30 min  Stress: No Stress Concern Present   Feeling of Stress : Only a little  Social Connections: Moderately Isolated   Frequency of Communication with Friends and Family: More than three times a week   Frequency of Social Gatherings with Friends and Family: More than three times a week   Attends Religious Services: 1 to 4 times per year   Active Member of [employer] or Organizations: No   Attends Engineer, structural: Never   Marital Status: Divorced    Tobacco Counseling Counseling given: Not Answered   Clinical Intake:  Pre-visit preparation completed: Yes  Pain : No/denies pain     BMI - recorded: 19.97 Nutritional Status: BMI of 19-24  Normal Nutritional Risks: None Diabetes: No  How often do you need to have someone help you when you read instructions, pamphlets, or other written materials from your doctor or pharmacy?: 1 - Never  Diabetic?No  Interpreter Needed?: No  Information entered by :: Thomasenia Sales LPN   Activities of Daily Living In your present state of health, do you have any difficulty performing the following activities: 05/19/2021 02/13/2021  Hearing? N N  Vision? N N  Difficulty concentrating or making decisions? N N  Walking or climbing stairs? N N  Dressing or bathing? N N  Doing errands, shopping? N N  Preparing Food and eating ? N -  Using the Toilet? N -  In the past six months, have you accidently leaked urine? N -  Do you  have problems with loss of bowel control? N -  Managing your Medications? N -  Managing your Finances? N -  Housekeeping or managing your Housekeeping? N -  Some recent data might be hidden    Patient Care Team: Copland, Gwenlyn Found, MD as PCP - General (Family Medicine) Su Grand, MD (Inactive) as Attending Physician (Urology) Harold Hedge, MD as Consulting Physician (Obstetrics and Gynecology)  Indicate any recent Medical Services you may have received from other than [facility] providers in the past year (date may be approximate).     Assessment:   This is a routine wellness examination for Keimani.  Hearing/Vision screen Hearing Screening - Comments:: No issues Vision Screening - Comments:: Last eye exam-05/2021-[facility]  Dietary issues and exercise activities discussed: Current Exercise Habits: Home exercise routine,  Type of exercise: walking, Time (Minutes): 30, Frequency (Times/Week): 4, Weekly Exercise (Minutes/Week): 120, Intensity: Mild, Exercise limited by: None identified   Goals Addressed             This Visit's Progress    Patient Stated       Drink more water, increase activity & eat more vehetables.       Depression Screen PHQ 2/9 Scores 05/19/2021 02/13/2021 01/01/2018 05/11/2015 04/16/2014  PHQ - 2 Score 0 0 0 0 1    Fall Risk Fall Risk  05/19/2021 02/13/2021 08/19/2019 01/01/2018 05/11/2015  Falls in the past year? 0 0 0 No No  Number falls in past yr: 0 0 - - -  Injury with Fall? 0 0 - - -  Follow up Falls prevention discussed - - - -    FALL RISK PREVENTION PERTAINING TO THE HOME:  Any stairs in or around the home? Yes  If so, are there any without handrails? No  Home free of loose throw rugs in walkways, pet beds, electrical cords, etc? Yes  Adequate lighting in your home to reduce risk of falls? Yes   ASSISTIVE DEVICES UTILIZED TO PREVENT FALLS:  Life alert? No  Use of a cane, walker or w/c? No  Grab bars in the bathroom? Yes  Shower chair or  bench in shower? Yes  Elevated toilet seat or a handicapped toilet? No   TIMED UP AND GO:  Was the test performed? No . Phone visit   Cognitive Function:Normal cognitive status assessed by this Nurse Health Advisor. No abnormalities found.          Immunizations Immunization History  Administered Date(s) Administered   Fluad Quad(high Dose 65+) 02/13/2021   Influenza Split 03/26/2013   PFIZER(Purple Top)SARS-COV-2 Vaccination 06/22/2019, 02/01/2020, 08/11/2020   Td 05/11/2015   Tdap 10/15/2005   Zoster, Live 03/26/2013    TDAP status: Up to date  Flu Vaccine status: Up to date  Pneumococcal vaccine status: Due, Education has been provided regarding the importance of this vaccine. Advised may receive this vaccine at local pharmacy or Health Dept. Aware to provide a copy of the vaccination record if obtained from local pharmacy or Health Dept. Verbalized acceptance and understanding.  Covid-19 vaccine status: Information provided on how to obtain vaccines.   Qualifies for Shingles Vaccine? Yes   Zostavax completed Yes   Shingrix Completed?: No.    Education has been provided regarding the importance of this vaccine. Patient has been advised to call insurance company to determine out of pocket expense if they have not yet received this vaccine. Advised may also receive vaccine at local pharmacy or Health Dept. Verbalized acceptance and understanding.  Screening Tests Health Maintenance  Topic Date Due   Zoster Vaccines- Shingrix (1 of 2) Never done   Pneumonia Vaccine 7565+ Years old (1 - PCV) Never done   COLONOSCOPY (Pts 45-531yrs Insurance coverage will need to be confirmed)  04/21/2020   COVID-19 Vaccine (4 - Booster for Pfizer series) 10/06/2020   MAMMOGRAM  11/19/2021   TETANUS/TDAP  05/10/2025   INFLUENZA VACCINE  Completed   DEXA SCAN  Completed   Hepatitis C Screening  Completed   HPV VACCINES  Aged Out    Health Maintenance  Health Maintenance Due  Topic  Date Due   Zoster Vaccines- Shingrix (1 of 2) Never done   Pneumonia Vaccine 5965+ Years old (1 - PCV) Never done   COLONOSCOPY (Pts 45-811yrs Insurance coverage will need to be confirmed)  04/21/2020   COVID-19 Vaccine (4 - Booster for Pfizer series) 10/06/2020    Colorectal cancer screening: Type of screening: Cologuard. Completed 02/24/2021. Repeat every 3 years  Mammogram status: Patient states she had mammogram done in November at [facility]. Report requested.  Bone Density status: Completed 11/20/2019. Results reflect: Bone density results: OSTEOPOROSIS. Repeat every 2 years.  Lung Cancer Screening: (Low Dose CT Chest recommended if Age 71-80 years, 30 pack-year currently smoking OR have quit w/in 15years.) does not qualify.     Additional Screening:  Hepatitis C Screening: Completed 04/16/2014  Vision Screening: Recommended annual ophthalmology exams for early detection of glaucoma and other disorders of the eye. Is the patient up to date with their annual eye exam?  Yes  Who is the provider or what is the name of the office in which the patient attends annual eye exams? [facility]   Dental Screening: Recommended annual dental exams for proper oral hygiene  Community Resource Referral / Chronic Care Management: CRR required this visit?  No   CCM required this visit?  No      Plan:     I have personally reviewed and noted the following in the patients chart:   Medical and social history Use of alcohol, tobacco or illicit drugs  Current medications and supplements including opioid prescriptions. Patient is not currently taking opioid prescriptions. Functional ability and status Nutritional status Physical activity Advanced directives List of other physicians Hospitalizations, surgeries, and ER visits in previous 12 months Vitals Screenings to include cognitive, depression, and falls Referrals and appointments  In addition, I have reviewed and discussed with patient  certain preventive protocols, quality metrics, and best practice recommendations. A written personalized care plan for preventive services as well as general preventive health recommendations were provided to patient.   Due to this being a telephonic visit, the after visit summary with patients personalized plan was offered to patient via mail or my-chart.  Per request, patient was mailed a copy of AVS.   Roanna Raider, LPN   12/31/2681  Nurse Health Advisor  Nurse Notes: None

## 2021-05-19 NOTE — Patient Instructions (Signed)
[name] , Thank you for taking time to complete your Medicare Wellness Visit. I appreciate your ongoing commitment to your health goals. Please review the following plan we discussed and let me know if I can assist you in the future.   Screening recommendations/referrals: Colonoscopy: Cologuard completed 02/24/2021-Due 02/25/2024 Mammogram: Per our conversation, completed in November. You will have report senr\t to Dr. Lorelei Pont. Bone Density: Completed 11/20/2019-Due 11/19/2021 Recommended yearly ophthalmology/optometry visit for glaucoma screening and checkup Recommended yearly dental visit for hygiene and checkup  Vaccinations: Influenza vaccine: Up to date Pneumococcal vaccine: Due-May obtain vaccine at our office or your local pharmacy. Tdap vaccine: Up to date Shingles vaccine: Due-May obtain vaccine at your local pharmacy. Covid-19:Booster available at the pharmacy  Advanced directives: Information mailed today  Conditions/risks identified: See problem list  Next appointment: Follow up in one year for your annual wellness visit    Preventive Care 65 Years and Older, Female Preventive care refers to lifestyle choices and visits with your health care provider that can promote health and wellness. What does preventive care include? A yearly physical exam. This is also called an annual well check. Dental exams once or twice a year. Routine eye exams. Ask your health care provider how often you should have your eyes checked. Personal lifestyle choices, including: Daily care of your teeth and gums. Regular physical activity. Eating a healthy diet. Avoiding tobacco and drug use. Limiting alcohol use. Practicing safe sex. Taking low-dose aspirin every day. Taking vitamin and mineral supplements as recommended by your health care provider. What happens during an annual well check? The services and screenings done by your health care provider during your annual well check will  depend on your age, overall health, lifestyle risk factors, and family history of disease. Counseling  Your health care provider may ask you questions about your: Alcohol use. Tobacco use. Drug use. Emotional well-being. Home and relationship well-being. Sexual activity. Eating habits. History of falls. Memory and ability to understand (cognition). Work and work Statistician. Reproductive health. Screening  You may have the following tests or measurements: Height, weight, and BMI. Blood pressure. Lipid and cholesterol levels. These may be checked every 5 years, or more frequently if you are over 23 years old. Skin check. Lung cancer screening. You may have this screening every year starting at age 59 if you have a 30-pack-year history of smoking and currently smoke or have quit within the past 15 years. Fecal occult blood test (FOBT) of the stool. You may have this test every year starting at age 58. Flexible sigmoidoscopy or colonoscopy. You may have a sigmoidoscopy every 5 years or a colonoscopy every 10 years starting at age 101. Hepatitis C blood test. Hepatitis B blood test. Sexually transmitted disease (STD) testing. Diabetes screening. This is done by checking your blood sugar (glucose) after you have not eaten for a while (fasting). You may have this done every 1-3 years. Bone density scan. This is done to screen for osteoporosis. You may have this done starting at age 3. Mammogram. This may be done every 1-2 years. Talk to your health care provider about how often you should have regular mammograms. Talk with your health care provider about your test results, treatment options, and if necessary, the need for more tests. Vaccines  Your health care provider may recommend certain vaccines, such as: Influenza vaccine. This is recommended every year. Tetanus, diphtheria, and acellular pertussis (Tdap, Td) vaccine. You may need a Td booster every 10 years. Zoster vaccine.  You may  need this after age 84. Pneumococcal 13-valent conjugate (PCV13) vaccine. One dose is recommended after age 6. Pneumococcal polysaccharide (PPSV23) vaccine. One dose is recommended after age 71. Talk to your health care provider about which screenings and vaccines you need and how often you need them. This information is not intended to replace advice given to you by your health care provider. Make sure you discuss any questions you have with your health care provider. Document Released: 04/22/2015 Document Revised: 12/14/2015 Document Reviewed: 01/25/2015 Elsevier Interactive Patient Education  2017 [place] Prevention in the Home Falls can cause injuries. They can happen to people of all ages. There are many things you can do to make your home safe and to help prevent falls. What can I do on the outside of my home? Regularly fix the edges of walkways and driveways and fix any cracks. Remove anything that might make you trip as you walk through a door, such as a raised step or threshold. Trim any bushes or trees on the path to your home. Use bright outdoor lighting. Clear any walking paths of anything that might make someone trip, such as rocks or tools. Regularly check to see if handrails are loose or broken. Make sure that both sides of any steps have handrails. Any raised decks and porches should have guardrails on the edges. Have any leaves, snow, or ice cleared regularly. Use sand or salt on walking paths during winter. Clean up any spills in your garage right away. This includes oil or grease spills. What can I do in the bathroom? Use night lights. Install grab bars by the toilet and in the tub and shower. Do not use towel bars as grab bars. Use non-skid mats or decals in the tub or shower. If you need to sit down in the shower, use a plastic, non-slip stool. Keep the floor dry. Clean up any water that spills on the floor as soon as it happens. Remove soap buildup in  the tub or shower regularly. Attach bath mats securely with double-sided non-slip rug tape. Do not have throw rugs and other things on the floor that can make you trip. What can I do in the bedroom? Use night lights. Make sure that you have a light by your bed that is easy to reach. Do not use any sheets or blankets that are too big for your bed. They should not hang down onto the floor. Have a firm chair that has side arms. You can use this for support while you get dressed. Do not have throw rugs and other things on the floor that can make you trip. What can I do in the kitchen? Clean up any spills right away. Avoid walking on wet floors. Keep items that you use a lot in easy-to-reach places. If you need to reach something above you, use a strong step stool that has a grab bar. Keep electrical cords out of the way. Do not use floor polish or wax that makes floors slippery. If you must use wax, use non-skid floor wax. Do not have throw rugs and other things on the floor that can make you trip. What can I do with my stairs? Do not leave any items on the stairs. Make sure that there are handrails on both sides of the stairs and use them. Fix handrails that are broken or loose. Make sure that handrails are as long as the stairways. Check any carpeting to make sure that it is  firmly attached to the stairs. Fix any carpet that is loose or worn. Avoid having throw rugs at the top or bottom of the stairs. If you do have throw rugs, attach them to the floor with carpet tape. Make sure that you have a light switch at the top of the stairs and the bottom of the stairs. If you do not have them, ask someone to add them for you. What else can I do to help prevent falls? Wear shoes that: Do not have high heels. Have rubber bottoms. Are comfortable and fit you well. Are closed at the toe. Do not wear sandals. If you use a stepladder: Make sure that it is fully opened. Do not climb a closed  stepladder. Make sure that both sides of the stepladder are locked into place. Ask someone to hold it for you, if possible. Clearly mark and make sure that you can see: Any grab bars or handrails. First and last steps. Where the edge of each step is. Use tools that help you move around (mobility aids) if they are needed. These include: Canes. Walkers. Scooters. Crutches. Turn on the lights when you go into a dark area. Replace any light bulbs as soon as they burn out. Set up your furniture so you have a clear path. Avoid moving your furniture around. If any of your floors are uneven, fix them. If there are any pets around you, be aware of where they are. Review your medicines with your doctor. Some medicines can make you feel dizzy. This can increase your chance of falling. Ask your doctor what other things that you can do to help prevent falls. This information is not intended to replace advice given to you by your health care provider. Make sure you discuss any questions you have with your health care provider. Document Released: 01/20/2009 Document Revised: 09/01/2015 Document Reviewed: 04/30/2014 Elsevier Interactive Patient Education  2017 [employer].

## 2021-08-14 ENCOUNTER — Other Ambulatory Visit: Payer: Self-pay | Admitting: Family Medicine

## 2021-08-14 DIAGNOSIS — M810 Age-related osteoporosis without current pathological fracture: Secondary | ICD-10-CM

## 2021-08-15 NOTE — Progress Notes (Addendum)
Therapist, music at [facility] ?[address], [address] ?[place], [place] [address] ?336 [phone] ?Fax [phone] ? ?Date:  08/16/2021  ? ?Name:  Margaret Murphy   DOB:  1951-01-11   MRN:  [id number] ? ?PCP:  Darreld Mclean, MD  ? ? ?Chief Complaint: Medication Refill (Concerns/ questions: none/Zoster: no Shingrix/PNA: none) ? ? ?History of Present Illness: ? ?Margaret Murphy is a 71 y.o. very pleasant female patient who presents with the following: ? ?Patient seen today for medication follow-up ?Most recent visit with myself was in November ?History of prediabetes and HTN, osteoporosis, IBS ?We tried fosamax but had SE-more recently we prescribed Boniva- she is generally tolerating okay, occasional slight headache ?She might feel like her hands are puffy on occasion ? ?Shingrix- recommend  ?Needs pneumonia vaccine- will do today  ?Can recommend COVID booster ?Mammogram, Cologuard up-to-date ?Bone density up-to-date- she prefers to wait to update next year  ?Lab work done [employer] anemia and leukopenia, recheck today.  A1c 5.8 ? ?Her home BP hs been ok when she checks it at home- admits she does not check that often  ?Patient Active Problem List  ? Diagnosis Date Noted  ? Osteoporosis 01/08/2018  ? Pre-diabetes 05/12/2015  ? HTN (hypertension) 12/19/2011  ? Depression with anxiety 12/19/2011  ? Hypercholesteremia 12/19/2011  ? History of urinary tract infection 12/19/2011  ? ? ?Past Medical History:  ?Diagnosis Date  ? Anxiety   ? Depression   ? Hypertension   ? ? ?Past Surgical History:  ?Procedure Laterality Date  ? CESAREAN SECTION    ? #1 1978  and #2 1989  ? ? ?Social History  ? ?Tobacco Use  ? Smoking status: Never  ? Smokeless tobacco: Never  ?Substance Use Topics  ? Alcohol use: No  ? Drug use: No  ? ? ?Family History  ?Problem Relation Age of Onset  ? Hypertension Mother   ?     dx in 9's- not sure of age  ? Diabetes Mother   ? Hypertension Maternal Grandmother   ?     does not know  age of dx  ? Glaucoma Father   ? Hypertension Sister   ? Diabetes Brother   ? Cancer Maternal Aunt 78  ?     Breast cancer  ? Hypertension Sister   ? Hypertension Maternal Uncle   ? ? ?Allergies  ?Allergen Reactions  ? Diuretic [Buchu-Cornsilk-Ch Grass-Hydran] Other (See Comments)  ?  cramps  ? Fosamax [Alendronate] Other (See Comments)  ?  Sharp abdominal pain, slowed urine stream, sore throat ?  ? Metoprolol Nausea And Vomiting  ? Sulfonamide Derivatives Other (See Comments)  ?  GI UPSET  ? ? ?Medication list has been reviewed and updated. ? ?Current Outpatient Medications on File Prior to Visit  ?Medication Sig Dispense Refill  ? Biotin 10000 MCG TABS Take by mouth.    ? ciclopirox (LOPROX) 0.77 % cream Apply topically 2 (two) times daily. Use as needed for foot rash 30 g 1  ? hyoscyamine (LEVSIN) 0.125 MG tablet Take 1 tablet (0.125 mg total) by mouth as needed. Max 1.5 mg in 24 hours 60 tablet 2  ? ibandronate (BONIVA) 150 MG tablet TAKE 1 TABLET BY MOUTH EVERY 30 DAYS WITH A FULL GLASS OF WATER ON AN EMPTY STOMACH DO NOT TAKE ANYTHING ELSE OR LIE DOWN FOR NEXT 30 MINUTES 1 tablet 0  ? ibuprofen (ADVIL,MOTRIN) 200 MG tablet Take 400 mg by mouth every  8 (eight) hours as needed for fever, headache, mild pain, moderate pain or cramping.    ? Multiple Vitamins-Minerals (CENTRUM SILVER 50+WOMEN PO) Take by mouth.    ? UNABLE TO FIND Med Name: Hempvana pain relief cream for knee pain    ? ?No current facility-administered medications on file prior to visit.  ? ? ?Review of Systems: ? ?As per HPI- otherwise negative. ? ? ?Physical Examination: ?Vitals:  ? 08/16/21 1520  ?BP: (!) 160/82  ?Pulse: 73  ?Resp: 18  ?Temp: 98.3 ?F (36.8 ?C)  ?SpO2: 99%  ? ?Vitals:  ? 08/16/21 1520  ?Weight: 122 lb 12.8 oz (55.7 kg)  ?Height: 5\' 5"  (1.651 m)  ? ?Body mass index is 20.43 kg/m?. ?Ideal Body Weight: Weight in (lb) to have BMI = 25: 149.9 ? ?GEN: no acute distress.  Slim build, looks well  ?HEENT: Atraumatic, Normocephalic.   Bilateral TM wnl, oropharynx normal.  PEERL,EOMI.   ?Ears and Nose: No external deformity. ?CV: RRR, No M/G/R. No JVD. No thrill. No extra heart sounds. ?PULM: CTA B, no wheezes, crackles, rhonchi. No retractions. No resp. distress. No accessory muscle use. ?ABD: S, NT, ND. No rebound. No HSM. ?EXTR: No c/c/e ?PSYCH: Normally interactive. Conversant.  ? ? ?Assessment and Plan: ?Prediabetes - Plan: Basic metabolic panel, Hemoglobin A1c ? ?Hypercholesteremia ? ?Hypertension, unspecified type - Plan: Basic metabolic panel, CBC ? ?Age-related osteoporosis without current pathological fracture ? ?IBS (irritable bowel syndrome) - Plan: hyoscyamine (LEVSIN) 0.125 MG tablet ? ?Tinea pedis of both feet - Plan: ciclopirox (LOPROX) 0.77 % cream ? ?Immunization due - Plan: Pneumococcal conjugate vaccine 20-valent (Prevnar 20) ? ?Following up today ?Pneumonia vaccine today ?She takes levsin just on occasion- needs fresh rx  ?Tolerating Boniva well- she would like to do her bone density in a few months  ?I asked her to monitor her BP at home and let me know if higher than 140/85 on a regular basis ?Will plan further follow- up pending labs. ? ? ?Signed ?Lamar Blinks, MD ? ?Received labs as below 5/12- letter to pt ? ?Results for orders placed or performed in visit on 08/16/21  ?Basic metabolic panel  ?Result Value Ref Range  ? Sodium 140 135 - 145 mEq/L  ? Potassium 3.9 3.5 - 5.1 mEq/L  ? Chloride 105 96 - 112 mEq/L  ? CO2 27 19 - 32 mEq/L  ? Glucose, Bld 89 70 - 99 mg/dL  ? BUN 20 6 - 23 mg/dL  ? Creatinine, Ser 0.84 0.40 - 1.20 mg/dL  ? GFR 70.37 >60.00 mL/min  ? Calcium 10.0 8.4 - 10.5 mg/dL  ?CBC  ?Result Value Ref Range  ? WBC 4.7 4.0 - 10.5 K/uL  ? RBC 4.01 3.87 - 5.11 Mil/uL  ? Platelets 230.0 150.0 - 400.0 K/uL  ? Hemoglobin 11.8 (L) 12.0 - 15.0 g/dL  ? HCT 36.1 36.0 - 46.0 %  ? MCV 90.1 78.0 - 100.0 fl  ? MCHC 32.6 30.0 - 36.0 g/dL  ? RDW 13.8 11.5 - 15.5 %  ?Hemoglobin A1c  ?Result Value Ref Range  ? Hgb A1c MFr Bld  5.7 4.6 - 6.5 %  ? ? ? ?

## 2021-08-16 ENCOUNTER — Telehealth: Payer: Self-pay

## 2021-08-16 ENCOUNTER — Ambulatory Visit: Payer: Medicare PPO | Admitting: Family Medicine

## 2021-08-16 VITALS — BP 160/82 | HR 73 | Temp 98.3°F | Resp 18 | Ht 65.0 in | Wt 122.8 lb

## 2021-08-16 DIAGNOSIS — B353 Tinea pedis: Secondary | ICD-10-CM

## 2021-08-16 DIAGNOSIS — K589 Irritable bowel syndrome without diarrhea: Secondary | ICD-10-CM | POA: Diagnosis not present

## 2021-08-16 DIAGNOSIS — R7303 Prediabetes: Secondary | ICD-10-CM | POA: Diagnosis not present

## 2021-08-16 DIAGNOSIS — M810 Age-related osteoporosis without current pathological fracture: Secondary | ICD-10-CM

## 2021-08-16 DIAGNOSIS — I10 Essential (primary) hypertension: Secondary | ICD-10-CM

## 2021-08-16 DIAGNOSIS — E7800 Pure hypercholesterolemia, unspecified: Secondary | ICD-10-CM

## 2021-08-16 DIAGNOSIS — Z23 Encounter for immunization: Secondary | ICD-10-CM | POA: Diagnosis not present

## 2021-08-16 MED ORDER — IBANDRONATE SODIUM 150 MG PO TABS: ORAL_TABLET | ORAL | 0 refills | Status: DC

## 2021-08-16 MED ORDER — HYOSCYAMINE SULFATE 0.125 MG PO TABS: 0.1250 mg | ORAL_TABLET | Freq: Every day | ORAL | 2 refills | Status: DC | PRN

## 2021-08-16 MED ORDER — CICLOPIROX OLAMINE 0.77 % EX CREA: TOPICAL_CREAM | Freq: Two times a day (BID) | CUTANEOUS | 1 refills | Status: AC

## 2021-08-16 NOTE — Patient Instructions (Signed)
Great to to see you again today ? ?I will be in touch with your labs ? ?Please monitor your BP at home- if running consistently higher than 140/85 I can start you on medication for high blood pressure  ?We can repeat bone density perhaps this fall ?Pneumonia vaccine given today ?I would suggest getting a covid booster and the new shingles series- shingrix- at your pharmacy at your convenience ? ?Take care!  Please see me in about 6 months ? ? ?

## 2021-08-16 NOTE — Telephone Encounter (Signed)
FYI: Pt would like Boniva to be sent 3 months at a time instead of 1 as it has been in the past. ?

## 2021-08-17 LAB — BASIC METABOLIC PANEL
BUN: 20 mg/dL (ref 6–23)
CO2: 27 mEq/L (ref 19–32)
Calcium: 10 mg/dL (ref 8.4–10.5)
Chloride: 105 mEq/L (ref 96–112)
Creatinine, Ser: 0.84 mg/dL (ref 0.40–1.20)
GFR: 70.37 mL/min (ref >60.00)
Glucose, Bld: 89 mg/dL (ref 70–99)
Potassium: 3.9 mEq/L (ref 3.5–5.1)
Sodium: 140 mEq/L (ref 135–145)

## 2021-08-17 LAB — CBC
HCT: 36.1 % (ref 36.0–46.0)
Hemoglobin: 11.8 g/dL — ABNORMAL LOW (ref 12.0–15.0)
MCHC: 32.6 g/dL (ref 30.0–36.0)
MCV: 90.1 fl (ref 78.0–100.0)
Platelets: 230 10*3/uL (ref 150.0–400.0)
RBC: 4.01 Mil/uL (ref 3.87–5.11)
RDW: 13.8 % (ref 11.5–15.5)
WBC: 4.7 10*3/uL (ref 4.0–10.5)

## 2021-08-17 LAB — HEMOGLOBIN A1C: Hgb A1c MFr Bld: 5.7 % (ref 4.6–6.5)

## 2021-12-05 ENCOUNTER — Telehealth: Payer: Self-pay | Admitting: Family Medicine

## 2021-12-05 ENCOUNTER — Other Ambulatory Visit: Payer: Self-pay

## 2021-12-05 DIAGNOSIS — M810 Age-related osteoporosis without current pathological fracture: Secondary | ICD-10-CM

## 2021-12-05 MED ORDER — IBANDRONATE SODIUM 150 MG PO TABS: ORAL_TABLET | ORAL | 0 refills | Status: DC

## 2021-12-05 NOTE — Telephone Encounter (Signed)
CPE is fine, no f/u needed before then.

## 2021-12-05 NOTE — Telephone Encounter (Signed)
Pt scheduled for CPE on 11.15.23

## 2021-12-05 NOTE — Telephone Encounter (Signed)
Pt called wondering about refill for Boniva. After reviewing chart, noticed that a note was put on Rx from visit on 5.10.23 to schedule a follow up. Does she need a follow up before her yearly CPE (still needing to be scheduled in Nov.)?

## 2022-01-02 ENCOUNTER — Other Ambulatory Visit: Payer: Self-pay | Admitting: Family Medicine

## 2022-01-02 DIAGNOSIS — M810 Age-related osteoporosis without current pathological fracture: Secondary | ICD-10-CM

## 2022-02-15 LAB — HM DEXA SCAN

## 2022-02-17 NOTE — Patient Instructions (Incomplete)
It was great to see you again today, I will be in touch with your labs Commend that you get the latest COVID-19 booster and RSV vaccine this fall

## 2022-02-17 NOTE — Progress Notes (Addendum)
[facility] [address], [address] [place], [place] [address] [phone]  Date:  02/21/2022   Name:  Margaret Murphy   DOB:  [birth year]   MRN:  [id number]  PCP:  Pearline Cables, MD    Chief Complaint: Annual Exam (Concerns/ questions: 1. pt noticed 2-3 weeks ago she stopped the Boniva due to Jaw pain. 2. Pt says her BP has been all over the place due to stress. /Flu shot today: none yet- determined after looking through chart//)   History of Present Illness:  Margaret Murphy is a 71 y.o. very pleasant female patient who presents with the following:  Patient seen today for physical- History of prediabetes and HTN, osteoporosis, IBS  Most recent visit with myself was in May  Shingrix Flu vaccine- give today  Recommend latest COVID booster, RSV Mammogram due this month- she has scheduled for later on this week Will add on a bone density order  Cologuard 2022 Lab work done in May-BMP, CBC, A1c  Can offer coronary calcium  She decided to stop Boniva which we started earlier this year due to concern of jaw pain.  When she stopped use the jaw pain did seem to go away She also did try fosamax in the past  Can repeat her bone density if she would like   Her home BP is running typically 130s/70s Patient Active Problem List   Diagnosis Date Noted   Osteoporosis 01/08/2018   Pre-diabetes 05/12/2015   HTN (hypertension) 12/19/2011   Depression with anxiety 12/19/2011   Hypercholesteremia 12/19/2011   History of urinary tract infection 12/19/2011    Past Medical History:  Diagnosis Date   Anxiety    Depression    Hypertension     Past Surgical History:  Procedure Laterality Date   CESAREAN SECTION     #1 1978  and #2 1989    Social History   Tobacco Use   Smoking status: Never   Smokeless tobacco: Never  Substance Use Topics   Alcohol use: No   Drug use: No    Family History  Problem Relation Age of  Onset   Hypertension Mother        dx in 79's- not sure of age   Diabetes Mother    Hypertension Maternal Grandmother        does not know age of dx   Glaucoma Father    Hypertension Sister    Diabetes Brother    Cancer Maternal Aunt 39       Breast cancer   Hypertension Sister    Hypertension Maternal Uncle     Allergies  Allergen Reactions   Diuretic [Buchu-Cornsilk-Ch Grass-Hydran] Other (See Comments)    cramps   Fosamax [Alendronate] Other (See Comments)    Sharp abdominal pain, slowed urine stream, sore throat    Metoprolol Nausea And Vomiting   Sulfonamide Derivatives Other (See Comments)    GI UPSET    Medication list has been reviewed and updated.  Current Outpatient Medications on File Prior to Visit  Medication Sig Dispense Refill   Biotin 00762 MCG TABS Take by mouth.     ciclopirox (LOPROX) 0.77 % cream Apply topically 2 (two) times daily. Use as needed for foot rash 30 g 1   hyoscyamine (LEVSIN) 0.125 MG tablet Take 1 tablet (0.125 mg total) by mouth daily as needed. Max 1.5 mg in 24 hours 60 tablet 2   ibuprofen (ADVIL,MOTRIN) 200  MG tablet Take 400 mg by mouth every 8 (eight) hours as needed for fever, headache, mild pain, moderate pain or cramping.     Multiple Vitamins-Minerals (CENTRUM SILVER 50+WOMEN PO) Take by mouth.     UNABLE TO FIND Med Name: Hempvana pain relief cream for knee pain     ibandronate (BONIVA) 150 MG tablet TAKE 1 TABLET BY MOUTH EVERY 30 DAYS WITH A FULL GLASS OF WATER AND ON AN EMPTY STOMACH. DO NOT TAKE ANYTHING ELSE OR LIE DOWN FOR NEXT 30 MINUTES (Patient not taking: Reported on 02/21/2022) 3 tablet 3   No current facility-administered medications on file prior to visit.    Review of Systems:  As per HPI- otherwise negative.   Physical Examination: Vitals:   02/21/22 0923  BP: 134/72  Pulse: 74  Resp: 18  Temp: 97.7 F (36.5 C)  SpO2: 98%   Vitals:   02/21/22 0923  Weight: 125 lb (56.7 kg)  Height: 5\' 5"  (1.651 m)    Body mass index is 20.8 kg/m. Ideal Body Weight: Weight in (lb) to have BMI = 25: 149.9  GEN: no acute distress. Slim build, looks well  HEENT: Atraumatic, Normocephalic. Bilateral TM wnl, oropharynx normal.  PEERL,EOMI.   Ears and Nose: No external deformity. CV: RRR, No M/G/R. No JVD. No thrill. No extra heart sounds. PULM: CTA B, no wheezes, crackles, rhonchi. No retractions. No resp. distress. No accessory muscle use. ABD: S, NT, ND, +BS. No rebound. No HSM. EXTR: No c/c/e PSYCH: Normally interactive. Conversant.    Assessment and Plan: Physical exam  Age-related osteoporosis without current pathological fracture - Plan: DG Bone Density  Prediabetes - Plan: Comprehensive metabolic panel, Hemoglobin A1c  Hypercholesteremia - Plan: Lipid panel, CT CARDIAC SCORING (SELF PAY ONLY)  Hypertension, unspecified type - Plan: CBC, Comprehensive metabolic panel, CT CARDIAC SCORING (SELF PAY ONLY)  Irritable bowel syndrome, unspecified type  Fatigue, unspecified type - Plan: CBC, TSH, VITAMIN D 25 Hydroxy (Vit-D Deficiency, Fractures)  Physical exam today.  Encouraged healthy diet and exercise routine Will plan further follow- up pending labs. She did stop her oral bisphosphonate due to concern of SE- will update dexa and take it from there.  She might be interested in Prolia Mammo is set up Flu shot today Ordered coronary calcium for her   Signed , MD  Received labs as below, letter to patient Results for orders placed or performed in visit on 02/21/22  CBC  Result Value Ref Range   WBC 4.7 4.0 - 10.5 K/uL   RBC 3.99 3.87 - 5.11 Mil/uL   Platelets 254.0 150.0 - 400.0 K/uL   Hemoglobin 11.7 (L) 12.0 - 15.0 g/dL   HCT 02/23/22 (L) 79.3 - 90.3 %   MCV 89.9 78.0 - 100.0 fl   MCHC 32.6 30.0 - 36.0 g/dL   RDW 00.9 23.3 - 00.7 %  Comprehensive metabolic panel  Result Value Ref Range   Sodium 139 135 - 145 mEq/L   Potassium 4.0 3.5 - 5.1 mEq/L   Chloride 104 96  - 112 mEq/L   CO2 32 19 - 32 mEq/L   Glucose, Bld 92 70 - 99 mg/dL   BUN 20 6 - 23 mg/dL   Creatinine, Ser 62.2 0.40 - 1.20 mg/dL   Total Bilirubin 0.5 0.2 - 1.2 mg/dL   Alkaline Phosphatase 35 (L) 39 - 117 U/L   AST 18 0 - 37 U/L   ALT 12 0 - 35 U/L   Total Protein  7.2 6.0 - 8.3 g/dL   Albumin 4.3 3.5 - 5.2 g/dL   GFR 63.01 >60.10 mL/min   Calcium 10.2 8.4 - 10.5 mg/dL  Hemoglobin X3A  Result Value Ref Range   Hgb A1c MFr Bld 6.0 4.6 - 6.5 %  Lipid panel  Result Value Ref Range   Cholesterol 252 (H) 0 - 200 mg/dL   Triglycerides 35.5 0.0 - 149.0 mg/dL   HDL 73.22 >02.54 mg/dL   VLDL 27.0 0.0 - 62.3 mg/dL   LDL Cholesterol 762 (H) 0 - 99 mg/dL   Total CHOL/HDL Ratio 3    NonHDL 169.07   TSH  Result Value Ref Range   TSH 1.51 0.35 - 5.50 uIU/mL  VITAMIN D 25 Hydroxy (Vit-D Deficiency, Fractures)  Result Value Ref Range   VITD 54.28 30.00 - 100.00 ng/mL    12/5- received her bone density from [facility] and a negative FOBT Call patient.  Her T score has improved from -3.3 in 8/21 to -2.5.  Still in osteoporosis range but does show improvement.  Let me know if she would like to use Prolia, this is likely a good idea as she still does have osteoporosis  FOBT negative.  Minimal anemia has been stable for several years, likely a personal characteristic.  Negative Cologuard in 2020

## 2022-02-21 ENCOUNTER — Other Ambulatory Visit: Payer: Self-pay

## 2022-02-21 ENCOUNTER — Ambulatory Visit (INDEPENDENT_AMBULATORY_CARE_PROVIDER_SITE_OTHER): Payer: Medicare PPO | Admitting: Family Medicine

## 2022-02-21 ENCOUNTER — Other Ambulatory Visit (INDEPENDENT_AMBULATORY_CARE_PROVIDER_SITE_OTHER): Payer: Medicare PPO

## 2022-02-21 VITALS — BP 134/72 | HR 74 | Temp 97.7°F | Resp 18 | Ht 65.0 in | Wt 125.0 lb

## 2022-02-21 DIAGNOSIS — Z23 Encounter for immunization: Secondary | ICD-10-CM

## 2022-02-21 DIAGNOSIS — D649 Anemia, unspecified: Secondary | ICD-10-CM | POA: Diagnosis not present

## 2022-02-21 DIAGNOSIS — Z0000 Encounter for general adult medical examination without abnormal findings: Secondary | ICD-10-CM

## 2022-02-21 DIAGNOSIS — R7303 Prediabetes: Secondary | ICD-10-CM | POA: Diagnosis not present

## 2022-02-21 DIAGNOSIS — E7800 Pure hypercholesterolemia, unspecified: Secondary | ICD-10-CM

## 2022-02-21 DIAGNOSIS — I10 Essential (primary) hypertension: Secondary | ICD-10-CM | POA: Diagnosis not present

## 2022-02-21 DIAGNOSIS — R5383 Other fatigue: Secondary | ICD-10-CM

## 2022-02-21 DIAGNOSIS — M810 Age-related osteoporosis without current pathological fracture: Secondary | ICD-10-CM | POA: Diagnosis not present

## 2022-02-21 DIAGNOSIS — K589 Irritable bowel syndrome without diarrhea: Secondary | ICD-10-CM

## 2022-02-21 LAB — COMPREHENSIVE METABOLIC PANEL
ALT: 12 U/L (ref 0–35)
AST: 18 U/L (ref 0–37)
Albumin: 4.3 g/dL (ref 3.5–5.2)
Alkaline Phosphatase: 35 U/L — ABNORMAL LOW (ref 39–117)
BUN: 20 mg/dL (ref 6–23)
CO2: 32 mEq/L (ref 19–32)
Calcium: 10.2 mg/dL (ref 8.4–10.5)
Chloride: 104 mEq/L (ref 96–112)
Creatinine, Ser: 0.78 mg/dL (ref 0.40–1.20)
GFR: 76.63 mL/min (ref >60.00)
Glucose, Bld: 92 mg/dL (ref 70–99)
Potassium: 4 mEq/L (ref 3.5–5.1)
Sodium: 139 mEq/L (ref 135–145)
Total Bilirubin: 0.5 mg/dL (ref 0.2–1.2)
Total Protein: 7.2 g/dL (ref 6.0–8.3)

## 2022-02-21 LAB — LIPID PANEL
Cholesterol: 252 mg/dL — ABNORMAL HIGH (ref 0–200)
HDL: 83.4 mg/dL (ref >39.00)
LDL Cholesterol: 154 mg/dL — ABNORMAL HIGH (ref 0–99)
NonHDL: 169.07
Total CHOL/HDL Ratio: 3
Triglycerides: 77 mg/dL (ref 0.0–149.0)
VLDL: 15.4 mg/dL (ref 0.0–40.0)

## 2022-02-21 LAB — CBC
HCT: 35.9 % — ABNORMAL LOW (ref 36.0–46.0)
Hemoglobin: 11.7 g/dL — ABNORMAL LOW (ref 12.0–15.0)
MCHC: 32.6 g/dL (ref 30.0–36.0)
MCV: 89.9 fl (ref 78.0–100.0)
Platelets: 254 10*3/uL (ref 150.0–400.0)
RBC: 3.99 Mil/uL (ref 3.87–5.11)
RDW: 14 % (ref 11.5–15.5)
WBC: 4.7 10*3/uL (ref 4.0–10.5)

## 2022-02-21 LAB — TSH: TSH: 1.51 u[IU]/mL (ref 0.35–5.50)

## 2022-02-21 LAB — HEMOGLOBIN A1C: Hgb A1c MFr Bld: 6 % (ref 4.6–6.5)

## 2022-02-21 LAB — VITAMIN D 25 HYDROXY (VIT D DEFICIENCY, FRACTURES): VITD: 54.28 ng/mL (ref 30.00–100.00)

## 2022-02-21 NOTE — Addendum Note (Signed)
Addended by: Abbe Amsterdam C on: 02/21/2022 08:02 PM   Modules accepted: Orders

## 2022-02-24 DIAGNOSIS — R197 Diarrhea, unspecified: Secondary | ICD-10-CM | POA: Diagnosis not present

## 2022-02-24 DIAGNOSIS — Z20822 Contact with and (suspected) exposure to covid-19: Secondary | ICD-10-CM | POA: Diagnosis not present

## 2022-02-24 DIAGNOSIS — R110 Nausea: Secondary | ICD-10-CM | POA: Diagnosis not present

## 2022-02-24 DIAGNOSIS — M791 Myalgia, unspecified site: Secondary | ICD-10-CM | POA: Diagnosis not present

## 2022-02-27 ENCOUNTER — Encounter: Payer: Self-pay | Admitting: Family Medicine

## 2022-02-27 ENCOUNTER — Ambulatory Visit (HOSPITAL_BASED_OUTPATIENT_CLINIC_OR_DEPARTMENT_OTHER)
Admission: RE | Admit: 2022-02-27 | Discharge: 2022-02-27 | Disposition: A | Discharge status: Home / Self Care | Payer: Medicare PPO | Source: Ambulatory Visit | Attending: Family Medicine | Admitting: Family Medicine

## 2022-02-27 DIAGNOSIS — E7800 Pure hypercholesterolemia, unspecified: Secondary | ICD-10-CM | POA: Insufficient documentation

## 2022-02-27 DIAGNOSIS — I10 Essential (primary) hypertension: Secondary | ICD-10-CM | POA: Insufficient documentation

## 2022-03-05 ENCOUNTER — Other Ambulatory Visit (INDEPENDENT_AMBULATORY_CARE_PROVIDER_SITE_OTHER): Payer: Medicare PPO

## 2022-03-05 DIAGNOSIS — D649 Anemia, unspecified: Secondary | ICD-10-CM

## 2022-03-06 DIAGNOSIS — M85852 Other specified disorders of bone density and structure, left thigh: Secondary | ICD-10-CM | POA: Diagnosis not present

## 2022-03-06 DIAGNOSIS — Z1231 Encounter for screening mammogram for malignant neoplasm of breast: Secondary | ICD-10-CM | POA: Diagnosis not present

## 2022-03-06 DIAGNOSIS — M810 Age-related osteoporosis without current pathological fracture: Secondary | ICD-10-CM | POA: Diagnosis not present

## 2022-03-06 DIAGNOSIS — M85851 Other specified disorders of bone density and structure, right thigh: Secondary | ICD-10-CM | POA: Diagnosis not present

## 2022-03-06 LAB — HM MAMMOGRAPHY

## 2022-03-07 LAB — FECAL OCCULT BLOOD, IMMUNOCHEMICAL: Fecal Occult Bld: NEGATIVE

## 2022-05-10 ENCOUNTER — Telehealth: Payer: Self-pay | Admitting: Family Medicine

## 2022-05-10 NOTE — Telephone Encounter (Signed)
Copied from [place] [id number]. Topic: Medicare AWV >> May 10, 2022  2:24 PM Devoria Glassing wrote: Reason for CRM: Left message for patient to schedule Annual Wellness Visit(AWV).  Please schedule with Health Nurse Advisor at [facility]. Please call [phone] ask for [facility].

## 2022-06-11 ENCOUNTER — Telehealth: Payer: Self-pay | Admitting: Family Medicine

## 2022-06-11 NOTE — Telephone Encounter (Signed)
Contacted Margaret Murphy to schedule their annual wellness visit. Appointment made for 06/22/2022.  Sherol Dade; Care Guide Ambulatory Clinical [facility] Direct Dial: [phone]

## 2022-06-25 ENCOUNTER — Other Ambulatory Visit (HOSPITAL_BASED_OUTPATIENT_CLINIC_OR_DEPARTMENT_OTHER): Payer: Self-pay

## 2022-06-25 ENCOUNTER — Emergency Department (HOSPITAL_BASED_OUTPATIENT_CLINIC_OR_DEPARTMENT_OTHER)
Admission: EM | Admit: 2022-06-25 | Discharge: 2022-06-25 | Disposition: A | Discharge status: Home / Self Care | Payer: Medicare PPO | Attending: Emergency Medicine | Admitting: Emergency Medicine

## 2022-06-25 ENCOUNTER — Emergency Department (HOSPITAL_BASED_OUTPATIENT_CLINIC_OR_DEPARTMENT_OTHER): Payer: Medicare PPO

## 2022-06-25 ENCOUNTER — Encounter (HOSPITAL_BASED_OUTPATIENT_CLINIC_OR_DEPARTMENT_OTHER): Payer: Self-pay

## 2022-06-25 ENCOUNTER — Other Ambulatory Visit: Payer: Self-pay

## 2022-06-25 DIAGNOSIS — I10 Essential (primary) hypertension: Secondary | ICD-10-CM

## 2022-06-25 DIAGNOSIS — M79662 Pain in left lower leg: Secondary | ICD-10-CM | POA: Diagnosis not present

## 2022-06-25 DIAGNOSIS — R202 Paresthesia of skin: Secondary | ICD-10-CM

## 2022-06-25 DIAGNOSIS — I209 Angina pectoris, unspecified: Secondary | ICD-10-CM | POA: Diagnosis not present

## 2022-06-25 DIAGNOSIS — I70212 Atherosclerosis of native arteries of extremities with intermittent claudication, left leg: Secondary | ICD-10-CM | POA: Diagnosis not present

## 2022-06-25 DIAGNOSIS — R030 Elevated blood-pressure reading, without diagnosis of hypertension: Secondary | ICD-10-CM | POA: Diagnosis not present

## 2022-06-25 DIAGNOSIS — Z79899 Other long term (current) drug therapy: Secondary | ICD-10-CM | POA: Diagnosis not present

## 2022-06-25 DIAGNOSIS — M79605 Pain in left leg: Secondary | ICD-10-CM | POA: Diagnosis not present

## 2022-06-25 DIAGNOSIS — R29818 Other symptoms and signs involving the nervous system: Secondary | ICD-10-CM | POA: Diagnosis not present

## 2022-06-25 DIAGNOSIS — M7989 Other specified soft tissue disorders: Secondary | ICD-10-CM | POA: Diagnosis not present

## 2022-06-25 DIAGNOSIS — R200 Anesthesia of skin: Secondary | ICD-10-CM | POA: Diagnosis present

## 2022-06-25 LAB — COMPREHENSIVE METABOLIC PANEL
ALT: 13 U/L (ref 0–44)
AST: 20 U/L (ref 15–41)
Albumin: 4.4 g/dL (ref 3.5–5.0)
Alkaline Phosphatase: 39 U/L (ref 38–126)
Anion gap: 8 (ref 5–15)
BUN: 18 mg/dL (ref 8–23)
CO2: 28 mmol/L (ref 22–32)
Calcium: 10.2 mg/dL (ref 8.9–10.3)
Chloride: 104 mmol/L (ref 98–111)
Creatinine, Ser: 0.79 mg/dL (ref 0.44–1.00)
GFR, Estimated: >60 mL/min (ref >60)
Glucose, Bld: 104 mg/dL — ABNORMAL HIGH (ref 70–99)
Potassium: 3.4 mmol/L — ABNORMAL LOW (ref 3.5–5.1)
Sodium: 140 mmol/L (ref 135–145)
Total Bilirubin: 0.4 mg/dL (ref 0.3–1.2)
Total Protein: 8.1 g/dL (ref 6.5–8.1)

## 2022-06-25 LAB — CBC
HCT: 38 % (ref 36.0–46.0)
Hemoglobin: 12.3 g/dL (ref 12.0–15.0)
MCH: 29.1 pg (ref 26.0–34.0)
MCHC: 32.4 g/dL (ref 30.0–36.0)
MCV: 90 fL (ref 80.0–100.0)
Platelets: 249 10*3/uL (ref 150–400)
RBC: 4.22 MIL/uL (ref 3.87–5.11)
RDW: 14.1 % (ref 11.5–15.5)
WBC: 3.5 10*3/uL — ABNORMAL LOW (ref 4.0–10.5)
nRBC: 0 % (ref 0.0–0.2)

## 2022-06-25 LAB — APTT: aPTT: 25 seconds (ref 24–36)

## 2022-06-25 LAB — DIFFERENTIAL
Abs Immature Granulocytes: 0.01 10*3/uL (ref 0.00–0.07)
Basophils Absolute: 0 10*3/uL (ref 0.0–0.1)
Basophils Relative: 1 %
Eosinophils Absolute: 0 10*3/uL (ref 0.0–0.5)
Eosinophils Relative: 0 %
Immature Granulocytes: 0 %
Lymphocytes Relative: 37 %
Lymphs Abs: 1.3 10*3/uL (ref 0.7–4.0)
Monocytes Absolute: 0.2 10*3/uL (ref 0.1–1.0)
Monocytes Relative: 7 %
Neutro Abs: 2 10*3/uL (ref 1.7–7.7)
Neutrophils Relative %: 55 %

## 2022-06-25 LAB — PROTIME-INR
INR: 0.9 (ref 0.8–1.2)
Prothrombin Time: 12.2 seconds (ref 11.4–15.2)

## 2022-06-25 LAB — ETHANOL: Alcohol, Ethyl (B): <10 mg/dL (ref <10)

## 2022-06-25 LAB — CBG MONITORING, ED: Glucose-Capillary: 89 mg/dL (ref 70–99)

## 2022-06-25 MED ORDER — HYDRALAZINE HCL 10 MG PO TABS
10.0000 mg | ORAL_TABLET | Freq: Once | ORAL | Status: AC
  Administered 2022-06-25: 10 mg via ORAL
  Filled 2022-06-25: qty 1

## 2022-06-25 MED ORDER — HYDRALAZINE HCL 10 MG PO TABS: 10.0000 mg | ORAL_TABLET | Freq: Three times a day (TID) | ORAL | 0 refills | Status: DC

## 2022-06-25 NOTE — ED Triage Notes (Addendum)
Patient here POV from Home.  Endorses Lip Tingling that began this AM. Unsure if she awoke with it or if it began today. Numbness/Tingling is to Left Side of Upper Lip. Noted the Numbness at 0800. LSN: 0000 today when Patient went to sleep.  Went to UC for Assessment of Left Lower Leg that began 1 Week ago and was sent for assessment of Paresthesia.   No CP. No SOB.   NAD Noted during Triage. A&Ox4. GCS 15. Ambulatory.

## 2022-06-25 NOTE — ED Provider Notes (Signed)
[name]   CSN: [id number] Arrival date & time: 06/25/22  1136     History  Chief Complaint  Patient presents with   Numbness    Margaret Murphy is a 72 y.o. female.  HPI Patient reports she went to urgent care for pain she was experiencing in her left lower leg.  She has been getting pain particularly with walking along the anterior lateral lower leg with some radiation to the top of the foot.  That has been going on for several days.  She also then noticed some bluish discoloration of the vein behind her knee.  She felt like there was some cramping in the calf as well.  She does note that she has been walking more recently.  Typically she gets quite a bit of exercise but over the winter was getting out less and now with nice weather has been doing more walking.  When at urgent care, she was noted to have high blood pressure.  Patient reports that she cannot remember when she noticed it but then she felt like there was some tingling on the side of her mouth.  Due to combination of elevated blood pressure and tingling there was concern urgent care for possible stroke evaluation and sent to the emergency department.  Patient denies she had other numbness or tingling in her arms or legs.  She did not experience incoordination or visual changes.  Patient does note that she was very anxious today about the pain in her leg and anxious going to urgent care.  She is not sure if her blood pressure was elevated due to possibly pain in her leg and/or significant anxiety this morning.  Patient reports that she used to be on blood pressure medications but there were number of them she did not tolerate very well.  She reports that ultimately they were completely discontinued.  She reports when she took blood pressure medicine, often her blood pressure get too low and she would have to stop them.  She cannot remember all of the medications that she  tried but she does remember taking amlodipine and not tolerating it very well.  She reports by home monitoring, blood pressures have not been significantly elevated off of medication.  No recent lifestyle changes.  Baseline patient stays active and is pretty healthy.    Home Medications Prior to Admission medications   Medication Sig Start Date End Date Taking? Authorizing Provider  hydrALAZINE (APRESOLINE) 10 MG tablet Take 1 tablet (10 mg total) by mouth 3 (three) times daily. 06/25/22  Yes Charlesetta Shanks, MD  Biotin 10000 MCG TABS Take by mouth.    [provider]  ciclopirox (LOPROX) 0.77 % cream Apply topically 2 (two) times daily. Use as needed for foot rash 08/16/21   Copland, Gay Filler, MD  hyoscyamine (LEVSIN) 0.125 MG tablet Take 1 tablet (0.125 mg total) by mouth daily as needed. Max 1.5 mg in 24 hours 08/16/21   Copland, Gay Filler, MD  ibandronate (BONIVA) 150 MG tablet TAKE 1 TABLET BY MOUTH EVERY 30 DAYS WITH A FULL GLASS OF WATER AND ON AN EMPTY STOMACH. DO NOT TAKE ANYTHING ELSE OR LIE DOWN FOR NEXT 30 MINUTES Patient not taking: Reported on 02/21/2022 01/02/22   Copland, Gay Filler, MD  ibuprofen (ADVIL,MOTRIN) 200 MG tablet Take 400 mg by mouth every 8 (eight) hours as needed for fever, headache, mild pain, moderate pain or cramping.    [provider]  Multiple Vitamins-Minerals (CENTRUM SILVER 50+WOMEN PO) Take by mouth.    [provider]  UNABLE TO FIND Med Name: Hempvana pain relief cream for knee pain    [provider]      Allergies    Diuretic [buchu-cornsilk-ch grass-hydran], Fosamax [alendronate], Metoprolol, and Sulfonamide derivatives    Review of Systems   Review of Systems  Physical Exam Updated Vital Signs BP (!) 174/79   Pulse 63   Temp 97.8 F (36.6 C)   Resp 18   Ht 5\' 5"  (1.651 m)   Wt 56.7 kg   SpO2 100%   BMI 20.80 kg/m  Physical Exam Constitutional:      Comments: Well-nourished well-developed.  Good  physical condition.  Mental status clear.  No respiratory distress.  HENT:     Mouth/Throat:     Mouth: Mucous membranes are moist.     Pharynx: Oropharynx is clear.  Eyes:     Extraocular Movements: Extraocular movements intact.     Conjunctiva/sclera: Conjunctivae normal.     Pupils: Pupils are equal, round, and reactive to light.  Cardiovascular:     Rate and Rhythm: Normal rate and regular rhythm.  Pulmonary:     Effort: Pulmonary effort is normal.     Breath sounds: Normal breath sounds.  Abdominal:     General: There is no distension.     Palpations: Abdomen is soft.     Tenderness: There is no abdominal tenderness. There is no guarding.  Musculoskeletal:        General: Tenderness present. No swelling or deformity. Normal range of motion.     Cervical back: Neck supple.     Right lower leg: No edema.     Left lower leg: No edema.     Comments: Legs are in good physical condition.  Legs are thin and musculature is symmetric.  No effusions at the knees or ankles.  Patient endorses tenderness over the anterior tibialis to the lateral margin of the left lower leg.  No focal areas of swelling, redness or perceptible soft tissue abnormality.  Calves are soft and complete compressible.  In standing position patient does have a small varicosity behind the left knee.  No significant fullness.  Both feet are warm and dry with 2+ peripheral pulses.  Skin:    General: Skin is warm and dry.  Neurological:     General: No focal deficit present.     Mental Status: She is oriented to person, place, and time.     Motor: No weakness.     Coordination: Coordination normal.     Comments: Cognitive function normal.  Speech clear.  No aphasia.  All movements are coordinated purposeful symmetric.  Grip strength 2+ symmetric.  Lower extremity strength normal.  Patient can sit stand and ambulate with normal gait.  She ambulated to the bathroom with symmetric and coordinated gait and function.   Psychiatric:        Mood and Affect: Mood normal.     ED Results / Procedures / Treatments   Labs (all labs ordered are listed, but only abnormal results are displayed) Labs Reviewed  CBC - Abnormal; Notable for the following components:      Result Value   WBC 3.5 (*)    All other components within normal limits  COMPREHENSIVE METABOLIC PANEL - Abnormal; Notable for the following components:   Potassium 3.4 (*)    Glucose, Bld 104 (*)    All other components within normal limits  PROTIME-INR  APTT  DIFFERENTIAL  ETHANOL  CBG MONITORING, ED    EKG EKG Interpretation  Date/Time:  Monday June 25 2022 11:50:35 EDT Ventricular Rate:  77 PR Interval:  128 QRS Duration: 80 QT Interval:  398 QTC Calculation: 450 R Axis:   73 Text Interpretation: Normal sinus rhythm Nonspecific ST and T wave abnormality Abnormal ECG No previous ECGs available agree Confirmed by Charlesetta Shanks [phone]) on 06/25/2022 12:59:19 PM  Radiology US Venous Img Lower  Left (DVT Study)  Result Date: 06/25/2022 CLINICAL DATA:  72 year old female with a history of swelling EXAM: LEFT LOWER EXTREMITY VENOUS DOPPLER ULTRASOUND TECHNIQUE: Gray-scale sonography with graded compression, as well as color Doppler and duplex ultrasound were performed to evaluate the lower extremity deep venous systems from the level of the common femoral vein and including the common femoral, femoral, profunda femoral, popliteal and calf veins including the posterior tibial, peroneal and gastrocnemius veins when visible. The superficial great saphenous vein was also interrogated. Spectral Doppler was utilized to evaluate flow at rest and with distal augmentation maneuvers in the common femoral, femoral and popliteal veins. COMPARISON:  None Available. FINDINGS: Contralateral Common Femoral Vein: Respiratory phasicity is normal and symmetric with the symptomatic side. No evidence of thrombus. Normal compressibility. Common Femoral Vein:  No evidence of thrombus. Normal compressibility, respiratory phasicity and response to augmentation. Saphenofemoral Junction: No evidence of thrombus. Normal compressibility and flow on color Doppler imaging. Profunda Femoral Vein: No evidence of thrombus. Normal compressibility and flow on color Doppler imaging. Femoral Vein: No evidence of thrombus. Normal compressibility, respiratory phasicity and response to augmentation. Popliteal Vein: No evidence of thrombus. Normal compressibility, respiratory phasicity and response to augmentation. Calf Veins: No evidence of thrombus. Normal compressibility and flow on color Doppler imaging. Superficial Great Saphenous Vein: No evidence of thrombus. Normal compressibility and flow on color Doppler imaging Other Findings:  None. IMPRESSION: Directed duplex of the left lower extremity negative for DVT Signed, Dulcy Fanny. Nadene Rubins, RPVI [facility] Radiology Electronically Signed   By: Corrie Mckusick D.O.   On: 06/25/2022 15:55   CT HEAD WO CONTRAST  Result Date: 06/25/2022 CLINICAL DATA:  Neuro deficit, acute, stroke suspected. EXAM: CT HEAD WITHOUT CONTRAST TECHNIQUE: Contiguous axial images were obtained from the base of the skull through the vertex without intravenous contrast. RADIATION DOSE REDUCTION: This exam was performed according to the departmental dose-optimization program which includes automated exposure control, adjustment of the mA and/or kV according to patient size and/or use of iterative reconstruction technique. COMPARISON:  MRI brain 06/24/2019. FINDINGS: Brain: No acute hemorrhage, mass effect or midline shift. Gray-white differentiation is preserved. No hydrocephalus. No extra-axial collection. Basilar cisterns are patent. Vascular: No hyperdense vessel or unexpected calcification. Skull: No calvarial fracture or suspicious bone lesion. Skull base is unremarkable. Sinuses/Orbits: Unremarkable.  Other: None. IMPRESSION: No acute intracranial abnormality. Electronically Signed   By: Emmit Alexanders M.D.   On: 06/25/2022 12:39    Procedures Procedures    Medications Ordered in ED Medications  hydrALAZINE (APRESOLINE) tablet 10 mg (10 mg Oral Given 06/25/22 1510)    ED Course/ Medical Decision Making/ A&P                             Medical Decision Making Amount and/or Complexity of Data Reviewed Labs: ordered. Radiology: ordered.  Risk Prescription drug management.   Patient presents as outlined.  Initial complaint at urgent care  was for left lower leg pain.  On exam and by history this is most consistent with musculoskeletal pain.  She identifies a very focal area on the anterior tibialis just to the lower portion where she is most point tender and this is most painful with walking.  There is some radiation then down to the top of the foot.  Soft tissues are normal.  Patient also became concerned and having noted some bluish discoloration in the popliteal fossa.  Dammann this in standing position.  No significant fullness.  She does have some subtle varicosities but on the aggregate excellent condition.  Will proceed with DVT study to rule out clot.  DVT study negative.  Patient was referred to the emergency department from urgent care due to concerns for hypertension and a report of tingling.  Patient's history for tingling is a bit vague.  She had difficulty recalling if it had been present before she went to urgent care or when she was there and her blood pressure was noted to be high.  She describes tingling on the left side of her face or around her mouth.  No other neurologic dysfunction.  No visual changes.  Patient advises that she was extremely anxious at that time but unclear if she was actually hyperventilating.  Patient's history is positive for previously diagnosed hypertension although it does not sound that she had significant high blood pressures.  She describes  having ultimately stop blood pressure medications altogether due to episodes of low blood pressure.  Patient may be experiencing essential hypertension as she has aged and will now again require treatment.  There are no signs of endorgan damage.  Review of systems does not indicate other triggers for exacerbation of hypertension.  Patient is in physically good condition and active at baseline without smoking or alcohol use.  Patient got basic stroke workup with CT head and lab work.  Diagnostic evaluation is normal.  Combined with history soft for CVA and normal exam in the emergency department, plan will be to use as needed hydralazine with careful blood pressure monitoring at home.  Patient is to follow-up with her PCP ideally this week with a journal including blood pressure measurements.  Due to intolerance of multiple medications although she cannot specifically remember what, I will start with some hydralazine to be used on as needed basis for blood pressures at 170 or greater.  Once patient follows up with PCP it can be determined if she should be continued on hydralazine or restart a first-line therapy for continuous use.  We have reviewed careful return precautions.  Patient is discharged in good condition with normal mental status and no neurologic dysfunction.        Final Clinical Impression(s) / ED Diagnoses Final diagnoses:  Leg pain, anterior, left  Essential hypertension  Paresthesia    Rx / DC Orders ED Discharge Orders          Ordered    hydrALAZINE (APRESOLINE) 10 MG tablet  3 times daily        06/25/22 1518              Charlesetta Shanks, MD 06/25/22 1626

## 2022-06-25 NOTE — Discharge Instructions (Signed)
1.  For your leg pain, you may take extra strength Tylenol per package instructions.  Due to your elevated blood pressure, do not use medications called NSAIDs.  This includes ibuprofen (Motrin), naproxen (Aleve).  If you are unsure or ask your pharmacist. 2.  Elevate and ice your leg for the next 3 to 5 days.  Follow-up with your doctor for recheck. 3.  Review management of hypertension.  At this time you need to keep a journal of blood pressure recordings.  Try to check your blood pressure and a common situation at about the same time each day 2-3 times.  If your blood pressure is 160 or greater systolic (top number), you may take hydralazine 10 mg.  You may take this in the morning afternoon and evening depending on your blood pressure readings.  You will need to follow-up with your doctor soon as possible to review ongoing management of your blood pressure.  Your doctor may opt to start you on another first-line medication for blood pressure management. 4.  Return to the emergency department immediately if you have any concerning stroke symptoms.  Review instructions for TIA and hypertension management.

## 2022-06-27 ENCOUNTER — Ambulatory Visit: Payer: Medicare PPO | Admitting: Family Medicine

## 2022-06-27 VITALS — BP 130/80 | HR 81 | Temp 97.6°F | Resp 18 | Ht 65.0 in | Wt 124.0 lb

## 2022-06-27 DIAGNOSIS — I10 Essential (primary) hypertension: Secondary | ICD-10-CM | POA: Diagnosis not present

## 2022-06-27 DIAGNOSIS — M79662 Pain in left lower leg: Secondary | ICD-10-CM | POA: Diagnosis not present

## 2022-06-27 NOTE — Patient Instructions (Addendum)
Good to see you again today Continue to monitor your BP at home; if you get concerned about your average readings being high I can start a medication for you   Please let me know if your leg pain is not improving over the next week or so- we can get an x-ray and have you see sports med if needed

## 2022-06-27 NOTE — Progress Notes (Signed)
[place] at [facility] [address] [place], [place] [address] [phone] [phone]  Date:  06/27/2022   Name:  Margaret Murphy   DOB:  Jan 21, 1951   MRN:  [id number]  PCP:  Darreld Mclean, MD    Chief Complaint: follow up- ER (Was seen for Paraesthesias, leg pain. Pt was prescribed Hydralazine 10 mg)   History of Present Illness:  Margaret Murphy is a 72 y.o. very pleasant female patient who presents with the following:  Pt seen today with concern of ER follow-up I last saw her in November for her CPE History of prediabetes and HTN, osteoporosis, IBS   2 days ago she was seen in the ER with left lower leg pain  She had gone to UC but noted elevated BP and tingling of her mouth, so they sent her to the ER for further workup [name] negative for DVT Patient was referred to the emergency department from urgent care due to concerns for hypertension and a report of tingling. Patient's history for tingling is a bit vague. She had difficulty recalling if it had been present before she went to urgent care or when she was there and her blood pressure was noted to be high. She describes tingling on the left side of her face or around her mouth. No other neurologic dysfunction. No visual changes. Patient advises that she was extremely anxious at that time but unclear if she was actually hyperventilating. Patient's history is positive for previously diagnosed hypertension although it does not sound that she had significant high blood pressures. She describes having ultimately stop blood pressure medications altogether due to episodes of low blood pressure. Patient may be experiencing essential hypertension as she has aged and will now again require treatment. There are no signs of endorgan damage. Review of systems does not indicate other triggers for exacerbation of hypertension. Patient is in physically good condition and active at baseline without smoking or  alcohol use. Patient got basic stroke workup with CT head and lab work. Diagnostic evaluation is normal. Combined with history soft for CVA and normal exam in the emergency department, plan will be to use as needed hydralazine with careful blood pressure monitoring at home. Patient is to follow-up with her PCP ideally this week with a journal including blood pressure measurements. Due to intolerance of multiple medications although she cannot specifically remember what, I will start with some hydralazine to be used on as needed basis for blood pressures at 170 or greater. Once patient follows up with PCP it can be determined if she should be continued on hydralazine or restart a first-line therapy for continuous use. We have reviewed careful return precautions. Patient is discharged in good condition with normal mental status and no neurologic dysfunction.   Labs done in the ER- CMP, CBC, Pt/INR, all ok   US Venous Img Lower  Left (DVT Study)  Result Date: 06/25/2022 CLINICAL DATA:  72 year old female with a history of swelling EXAM: LEFT LOWER EXTREMITY VENOUS DOPPLER ULTRASOUND TECHNIQUE: Gray-scale sonography with graded compression, as well as color Doppler and duplex ultrasound were performed to evaluate the lower extremity deep venous systems from the level of the common femoral vein and including the common femoral, femoral, profunda femoral, popliteal and calf veins including the posterior tibial, peroneal and gastrocnemius veins when visible. The superficial great saphenous vein was also interrogated. Spectral Doppler was utilized to evaluate flow at rest and with distal augmentation maneuvers in  the common femoral, femoral and popliteal veins. COMPARISON:  None Available. FINDINGS: Contralateral Common Femoral Vein: Respiratory phasicity is normal and symmetric with the symptomatic side. No evidence of thrombus. Normal compressibility. Common Femoral Vein: No evidence of thrombus. Normal  compressibility, respiratory phasicity and response to augmentation. Saphenofemoral Junction: No evidence of thrombus. Normal compressibility and flow on color Doppler imaging. Profunda Femoral Vein: No evidence of thrombus. Normal compressibility and flow on color Doppler imaging. Femoral Vein: No evidence of thrombus. Normal compressibility, respiratory phasicity and response to augmentation. Popliteal Vein: No evidence of thrombus. Normal compressibility, respiratory phasicity and response to augmentation. Calf Veins: No evidence of thrombus. Normal compressibility and flow on color Doppler imaging. Superficial Great Saphenous Vein: No evidence of thrombus. Normal compressibility and flow on color Doppler imaging Other Findings:  None. IMPRESSION: Directed duplex of the left lower extremity negative for DVT Signed, Dulcy Fanny. Nadene Rubins, RPVI [facility] Radiology Electronically Signed   By: Corrie Mckusick D.O.   On: 06/25/2022 15:55   CT HEAD WO CONTRAST  Result Date: 06/25/2022 CLINICAL DATA:  Neuro deficit, acute, stroke suspected. EXAM: CT HEAD WITHOUT CONTRAST TECHNIQUE: Contiguous axial images were obtained from the base of the skull through the vertex without intravenous contrast. RADIATION DOSE REDUCTION: This exam was performed according to the departmental dose-optimization program which includes automated exposure control, adjustment of the mA and/or kV according to patient size and/or use of iterative reconstruction technique. COMPARISON:  MRI brain 06/24/2019. FINDINGS: Brain: No acute hemorrhage, mass effect or midline shift. Gray-white differentiation is preserved. No hydrocephalus. No extra-axial collection. Basilar cisterns are patent. Vascular: No hyperdense vessel or unexpected calcification. Skull: No calvarial fracture or suspicious bone lesion. Skull base is unremarkable. Sinuses/Orbits: Unremarkable. Other: None. IMPRESSION: No acute  intracranial abnormality. Electronically Signed   By: Emmit Alexanders M.D.   On: 06/25/2022 12:39    BP Readings from Last 3 Encounters:  06/27/22 130/80  06/25/22 (!) 174/79  02/21/22 134/72   Pt notes on 3/8 she first noted the pain in her left lower leg- she had to go out of town and did not attend to her right away.  She is not aware of any particular injury or change in her routine that could have caused her leg pain.  However she notes she does do quite a bit of walking and physical activity typically It still hurts in the shin just with walking- does not hurt at rest/ when sitting or laying down  It does not sound like she was taking NSAIDs at all recently  She has not used the hydralazine at home She is using tylenol for pain as needed  She has used amlodipine and losartan in the past but her BP would sometimes be too low.  She is not currently taking any antihypertensives Last night her home BP was 120/70s; she does have a blood pressure cuff and can monitor her readings  Patient Active Problem List   Diagnosis Date Noted   Osteoporosis 01/08/2018   Pre-diabetes 05/12/2015   HTN (hypertension) 12/19/2011   Depression with anxiety 12/19/2011   Hypercholesteremia 12/19/2011   History of urinary tract infection 12/19/2011    Past Medical History:  Diagnosis Date   Anxiety    Depression    Hypertension     Past Surgical History:  Procedure Laterality Date   CESAREAN SECTION     #1 1978  and #2 1989    Social History   Tobacco Use  Smoking status: Never   Smokeless tobacco: Never  Substance Use Topics   Alcohol use: No   Drug use: No    Family History  Problem Relation Age of Onset   Hypertension Mother        dx in 26's- not sure of age   Diabetes Mother    Hypertension Maternal Grandmother        does not know age of dx   Glaucoma Father    Hypertension Sister    Diabetes Brother    Cancer Maternal Aunt 78       Breast cancer   Hypertension Sister     Hypertension Maternal Uncle     Allergies  Allergen Reactions   Diuretic [Buchu-Cornsilk-Ch Grass-Hydran] Other (See Comments)    cramps   Fosamax [Alendronate] Other (See Comments)    Sharp abdominal pain, slowed urine stream, sore throat    Metoprolol Nausea And Vomiting   Sulfonamide Derivatives Other (See Comments)    GI UPSET    Medication list has been reviewed and updated.  Current Outpatient Medications on File Prior to Visit  Medication Sig Dispense Refill   Biotin 10000 MCG TABS Take by mouth.     ciclopirox (LOPROX) 0.77 % cream Apply topically 2 (two) times daily. Use as needed for foot rash 30 g 1   hydrALAZINE (APRESOLINE) 10 MG tablet Take 1 tablet (10 mg total) by mouth 3 (three) times daily. 60 tablet 0   hyoscyamine (LEVSIN) 0.125 MG tablet Take 1 tablet (0.125 mg total) by mouth daily as needed. Max 1.5 mg in 24 hours 60 tablet 2   ibandronate (BONIVA) 150 MG tablet TAKE 1 TABLET BY MOUTH EVERY 30 DAYS WITH A FULL GLASS OF WATER AND ON AN EMPTY STOMACH. DO NOT TAKE ANYTHING ELSE OR LIE DOWN FOR NEXT 30 MINUTES 3 tablet 3   ibuprofen (ADVIL,MOTRIN) 200 MG tablet Take 400 mg by mouth every 8 (eight) hours as needed for fever, headache, mild pain, moderate pain or cramping.     Multiple Vitamins-Minerals (CENTRUM SILVER 50+WOMEN PO) Take by mouth.     UNABLE TO FIND Med Name: Hempvana pain relief cream for knee pain     No current facility-administered medications on file prior to visit.    Review of Systems:  As per HPI- otherwise negative.   Physical Examination: Vitals:   06/27/22 1024 06/27/22 1038  BP: (!) 154/80 130/80  Pulse: 81   Resp: 18   Temp: 97.6 F (36.4 C)   SpO2: 99%    Vitals:   06/27/22 1024  Weight: 124 lb (56.2 kg)  Height: 5\' 5"  (1.651 m)   Body mass index is 20.63 kg/m. Ideal Body Weight: Weight in (lb) to have BMI = 25: 149.9  GEN: no acute distress.  Slim build, looks well  HEENT: Atraumatic, Normocephalic.  Ears and  Nose: No external deformity. CV: RRR, No M/G/R. No JVD. No thrill. No extra heart sounds. PULM: CTA B, no wheezes, crackles, rhonchi. No retractions. No resp. distress. No accessory muscle use. ABD: S, NT, ND, EXTR: No c/c/e PSYCH: Normally interactive. Conversant.  Left lower leg displays no swelling, redness or heat.  There is some mild tenderness over the left anterior shin, more laterally.  The calf muscle is soft and nontender.  Normal range of motion of her knee and ankle.  Foot is negative  Assessment and Plan: Pain in left shin  Hypertension, unspecified type Patient seen today for follow-up from emergency department.  She originally went to urgent care with left leg pain but was then referred to the ER due to concerns of hypertension and tingling around her mouth.  Her workup was benign, she notes the tingling is no longer present. Suspect she has a mild overuse injury of her left leg, offered to get x-rays and/will refer to sports medicine at this time.  She would prefer to observe this a bit longer, she will let me know if symptoms do not resolve We also discussed her blood pressure, I offered to restart her on a low-dose of medication such as losartan.  For the time being she declines, she prefers to continue monitoring her blood pressure at home which is reasonable  She will let me know if any concerns about persistent leg pain or elevated blood pressure  Signed Lamar Blinks, MD

## 2022-07-02 ENCOUNTER — Telehealth: Payer: Self-pay

## 2022-07-02 NOTE — Telephone Encounter (Signed)
        Patient  visited [place] on 3/18   Telephone encounter attempt : 1ST   A HIPAA compliant voice message was left requesting a return call.  Instructed patient to call back .    [facility] [phone] [address], [place], [place] [address] Phone: [phone] Email: [email or web address]

## 2022-07-03 ENCOUNTER — Telehealth: Payer: Self-pay

## 2022-07-03 NOTE — Telephone Encounter (Signed)
        Patient  visited [place] on 3/18    Telephone encounter attempt :  2nd  A HIPAA compliant voice message was left requesting a return call.  Instructed patient to call back .    [facility] [phone] [address], [place], [place] [address] Phone: [phone] Email: [email or web address]

## 2022-11-08 DEATH — deceased

## 2022-12-24 DIAGNOSIS — R220 Localized swelling, mass and lump, head: Secondary | ICD-10-CM | POA: Diagnosis not present

## 2022-12-24 DIAGNOSIS — H00012 Hordeolum externum right lower eyelid: Secondary | ICD-10-CM | POA: Diagnosis not present

## 2023-02-20 NOTE — Patient Instructions (Addendum)
It was great to see you again today, I will be in touch with your lab work If not done already recommend shingles vaccine series, seasonal flu shot, COVID booster  Please let me know if your home BP is running higher than 140/85 on average.  In that case we may want to try medication for you

## 2023-02-20 NOTE — Progress Notes (Signed)
[facility] [address] [place], [place] [address] [phone]  Date:  02/25/2023   Name:  Margaret Murphy   DOB:  January 19, 1951   MRN:  [id number]  PCP:  Pearline Cables, MD    Chief Complaint: Annual Exam   History of Present Illness:  Margaret Murphy is a 72 y.o. very pleasant female patient who presents with the following:  Patient seen today for physical exam Most recent visit with myself was in March when she was dealing with left leg pain.  She had actually been evaluated at the ER for this issue History of prediabetes and HTN, osteoporosis, IBS   Lab work-some labs done in March, can update today.  She is fasting  Mammogram about 1 year ago- this is scheduled  DEXA scan about 1 year ago- will repeat in one year  Cologuard due next year Flu shot- she declines today  COVID booster Shingrix- she did do zostavax but not shingrix as of yet   Boniva once a month prescription on chart- she notes she is not taking due to SE.  She stopped using this at some point last year   She does check her BP at home; she was getting systolic blood pressure less than 140 at home so she stopped checking it  She is not taking any BP meds right now  She enjoys walking and yard work No CP or SOB  No PMP  BP Readings from Last 3 Encounters:  02/25/23 (!) 140/88  06/27/22 130/80  06/25/22 (!) 174/79     Patient Active Problem List   Diagnosis Date Noted   Osteoporosis 01/08/2018   Pre-diabetes 05/12/2015   HTN (hypertension) 12/19/2011   Depression with anxiety 12/19/2011   Hypercholesteremia 12/19/2011   History of urinary tract infection 12/19/2011    Past Medical History:  Diagnosis Date   Anxiety    Depression    Hypertension     Past Surgical History:  Procedure Laterality Date   CESAREAN SECTION     #1 1978  and #2 1989    Social History   Tobacco Use   Smoking status: Never   Smokeless  tobacco: Never  Substance Use Topics   Alcohol use: No   Drug use: No    Family History  Problem Relation Age of Onset   Hypertension Mother        dx in 11's- not sure of age   Diabetes Mother    Hypertension Maternal Grandmother        does not know age of dx   Glaucoma Father    Hypertension Sister    Diabetes Brother    Cancer Maternal Aunt 105       Breast cancer   Hypertension Sister    Hypertension Maternal Uncle     Allergies  Allergen Reactions   Diuretic [Buchu-Cornsilk-Ch Grass-Hydran] Other (See Comments)    cramps   Fosamax [Alendronate] Other (See Comments)    Sharp abdominal pain, slowed urine stream, sore throat    Metoprolol Nausea And Vomiting   Sulfonamide Derivatives Other (See Comments)    GI UPSET    Medication list has been reviewed and updated.  Current Outpatient Medications on File Prior to Visit  Medication Sig Dispense Refill   Biotin 44010 MCG TABS Take by mouth.     ciclopirox (LOPROX) 0.77 % cream Apply topically 2 (two) times daily. Use as needed for foot rash  30 g 1   ibandronate (BONIVA) 150 MG tablet TAKE 1 TABLET BY MOUTH EVERY 30 DAYS WITH A FULL GLASS OF WATER AND ON AN EMPTY STOMACH. DO NOT TAKE ANYTHING ELSE OR LIE DOWN FOR NEXT 30 MINUTES 3 tablet 3   ibuprofen (ADVIL,MOTRIN) 200 MG tablet Take 400 mg by mouth every 8 (eight) hours as needed for fever, headache, mild pain, moderate pain or cramping.     Multiple Vitamins-Minerals (CENTRUM SILVER 50+WOMEN PO) Take by mouth.     UNABLE TO FIND Med Name: Hempvana pain relief cream for knee pain     No current facility-administered medications on file prior to visit.    Review of Systems:  As per HPI- otherwise negative.   Physical Examination: Vitals:   02/25/23 0904  BP: (!) 140/88  Pulse: 76  Resp: 18  Temp: 98.2 F (36.8 C)  SpO2: 94%   Vitals:   02/25/23 0904  Weight: 127 lb 6.4 oz (57.8 kg)  Height: 5\' 5"  (1.651 m)   Body mass index is 21.2 kg/m. Ideal  Body Weight: Weight in (lb) to have BMI = 25: 149.9  GEN: no acute distress.  Normal weight, looks well and younger than age HEENT: Atraumatic, Normocephalic.  Bilateral TM wnl, oropharynx normal.  PEERL,EOMI.   Ears and Nose: No external deformity. CV: RRR, No M/G/R. No JVD. No thrill. No extra heart sounds. PULM: CTA B, no wheezes, crackles, rhonchi. No retractions. No resp. distress. No accessory muscle use. ABD: S, NT, ND, +BS. No rebound. No HSM. EXTR: No c/c/e PSYCH: Normally interactive. Conversant.    Assessment and Plan: Physical exam  Age-related osteoporosis without current pathological fracture  Prediabetes - Plan: Comprehensive metabolic panel, Hemoglobin A1c  Hypertension, unspecified type - Plan: CBC, Comprehensive metabolic panel  Hypercholesteremia - Plan: Lipid panel  Mild anemia - Plan: CBC  Fatigue, unspecified type - Plan: TSH  IBS (irritable bowel syndrome) - Plan: hyoscyamine (LEVSIN) 0.125 MG tablet  Physical exam today.  Encouraged healthy diet and exercise routine Will plan further follow- up pending labs. For the time being she elects not to start blood pressure medication.  She notes that she has tried a few medications in the past and they tend to make her blood pressure too low so she felt dizzy. She uses Levsin on occasion for IBS, refilled  Signed Abbe Amsterdam, MD  Received labs as below-letter to pt as she does not have mychart   Results for orders placed or performed in visit on 02/25/23  CBC  Result Value Ref Range   WBC 3.1 (L) 4.0 - 10.5 K/uL   RBC 4.33 3.87 - 5.11 Mil/uL   Platelets 239.0 150.0 - 400.0 K/uL   Hemoglobin 12.5 12.0 - 15.0 g/dL   HCT 36.6 44.0 - 34.7 %   MCV 89.6 78.0 - 100.0 fl   MCHC 32.1 30.0 - 36.0 g/dL   RDW 42.5 95.6 - 38.7 %  Comprehensive metabolic panel  Result Value Ref Range   Sodium 139 135 - 145 mEq/L   Potassium 4.2 3.5 - 5.1 mEq/L   Chloride 103 96 - 112 mEq/L   CO2 29 19 - 32 mEq/L    Glucose, Bld 97 70 - 99 mg/dL   BUN 16 6 - 23 mg/dL   Creatinine, Ser 5.64 0.40 - 1.20 mg/dL   Total Bilirubin 0.5 0.2 - 1.2 mg/dL   Alkaline Phosphatase 46 39 - 117 U/L   AST 19 0 - 37 U/L  ALT 12 0 - 35 U/L   Total Protein 7.2 6.0 - 8.3 g/dL   Albumin 4.5 3.5 - 5.2 g/dL   GFR 16.10 >96.04 mL/min   Calcium 10.6 (H) 8.4 - 10.5 mg/dL  Hemoglobin V4U  Result Value Ref Range   Hgb A1c MFr Bld 5.9 4.6 - 6.5 %  Lipid panel  Result Value Ref Range   Cholesterol 261 (H) 0 - 200 mg/dL   Triglycerides 98.1 0.0 - 149.0 mg/dL   HDL 19.14 >78.29 mg/dL   VLDL 56.2 0.0 - 13.0 mg/dL   LDL Cholesterol 865 (H) 0 - 99 mg/dL   Total CHOL/HDL Ratio 3    NonHDL 185.36   TSH  Result Value Ref Range   TSH 1.53 0.35 - 5.50 uIU/mL

## 2023-02-25 ENCOUNTER — Encounter: Payer: Self-pay | Admitting: Family Medicine

## 2023-02-25 ENCOUNTER — Ambulatory Visit: Payer: Medicare PPO | Admitting: Family Medicine

## 2023-02-25 VITALS — BP 140/88 | HR 76 | Temp 98.2°F | Resp 18 | Ht 65.0 in | Wt 127.4 lb

## 2023-02-25 DIAGNOSIS — R5383 Other fatigue: Secondary | ICD-10-CM

## 2023-02-25 DIAGNOSIS — I10 Essential (primary) hypertension: Secondary | ICD-10-CM | POA: Diagnosis not present

## 2023-02-25 DIAGNOSIS — D649 Anemia, unspecified: Secondary | ICD-10-CM

## 2023-02-25 DIAGNOSIS — R7303 Prediabetes: Secondary | ICD-10-CM | POA: Diagnosis not present

## 2023-02-25 DIAGNOSIS — E7800 Pure hypercholesterolemia, unspecified: Secondary | ICD-10-CM

## 2023-02-25 DIAGNOSIS — M810 Age-related osteoporosis without current pathological fracture: Secondary | ICD-10-CM

## 2023-02-25 DIAGNOSIS — Z0000 Encounter for general adult medical examination without abnormal findings: Secondary | ICD-10-CM | POA: Diagnosis not present

## 2023-02-25 DIAGNOSIS — K589 Irritable bowel syndrome without diarrhea: Secondary | ICD-10-CM

## 2023-02-25 LAB — LIPID PANEL
Cholesterol: 261 mg/dL — ABNORMAL HIGH (ref 0–200)
HDL: 76.1 mg/dL (ref >39.00)
LDL Cholesterol: 171 mg/dL — ABNORMAL HIGH (ref 0–99)
NonHDL: 185.36
Total CHOL/HDL Ratio: 3
Triglycerides: 71 mg/dL (ref 0.0–149.0)
VLDL: 14.2 mg/dL (ref 0.0–40.0)

## 2023-02-25 LAB — CBC
HCT: 38.8 % (ref 36.0–46.0)
Hemoglobin: 12.5 g/dL (ref 12.0–15.0)
MCHC: 32.1 g/dL (ref 30.0–36.0)
MCV: 89.6 fL (ref 78.0–100.0)
Platelets: 239 10*3/uL (ref 150.0–400.0)
RBC: 4.33 Mil/uL (ref 3.87–5.11)
RDW: 14.3 % (ref 11.5–15.5)
WBC: 3.1 10*3/uL — ABNORMAL LOW (ref 4.0–10.5)

## 2023-02-25 LAB — COMPREHENSIVE METABOLIC PANEL
ALT: 12 U/L (ref 0–35)
AST: 19 U/L (ref 0–37)
Albumin: 4.5 g/dL (ref 3.5–5.2)
Alkaline Phosphatase: 46 U/L (ref 39–117)
BUN: 16 mg/dL (ref 6–23)
CO2: 29 meq/L (ref 19–32)
Calcium: 10.6 mg/dL — ABNORMAL HIGH (ref 8.4–10.5)
Chloride: 103 meq/L (ref 96–112)
Creatinine, Ser: 0.78 mg/dL (ref 0.40–1.20)
GFR: 76.09 mL/min (ref >60.00)
Glucose, Bld: 97 mg/dL (ref 70–99)
Potassium: 4.2 meq/L (ref 3.5–5.1)
Sodium: 139 meq/L (ref 135–145)
Total Bilirubin: 0.5 mg/dL (ref 0.2–1.2)
Total Protein: 7.2 g/dL (ref 6.0–8.3)

## 2023-02-25 LAB — TSH: TSH: 1.53 u[IU]/mL (ref 0.35–5.50)

## 2023-02-25 LAB — HEMOGLOBIN A1C: Hgb A1c MFr Bld: 5.9 % (ref 4.6–6.5)

## 2023-02-25 MED ORDER — HYOSCYAMINE SULFATE 0.125 MG PO TABS: 0.1250 mg | ORAL_TABLET | Freq: Every day | ORAL | 2 refills | Status: AC | PRN

## 2023-03-25 ENCOUNTER — Telehealth: Payer: Self-pay | Admitting: Family Medicine

## 2023-03-25 NOTE — Telephone Encounter (Signed)
Copied from CRM [id number]. Topic: Medicare AWV >> Mar 25, 2023 10:44 AM Payton Doughty wrote: Reason for CRM: Called LVM 03/25/2023 to schedule AWV TELEHEALTH ONLY  Verlee Rossetti; Care Guide Ambulatory Clinical Support [place] [facility] Direct Dial: [phone]

## 2023-04-17 ENCOUNTER — Ambulatory Visit: Payer: Self-pay | Admitting: Family Medicine

## 2023-04-17 ENCOUNTER — Ambulatory Visit: Payer: Medicare PPO | Admitting: Internal Medicine

## 2023-04-17 VITALS — BP 142/88 | HR 78 | Temp 97.9°F | Ht 65.0 in | Wt 130.0 lb

## 2023-04-17 DIAGNOSIS — R002 Palpitations: Secondary | ICD-10-CM

## 2023-04-17 DIAGNOSIS — I10 Essential (primary) hypertension: Secondary | ICD-10-CM | POA: Diagnosis not present

## 2023-04-17 DIAGNOSIS — F419 Anxiety disorder, unspecified: Secondary | ICD-10-CM

## 2023-04-17 MED ORDER — HYDRALAZINE HCL 10 MG PO TABS: 10.0000 mg | ORAL_TABLET | Freq: Two times a day (BID) | ORAL | 0 refills | Status: AC | PRN

## 2023-04-17 NOTE — Progress Notes (Signed)
 Subjective:    Patient ID: Margaret Murphy, female    DOB: 1951/02/07, 73 y.o.   MRN: [id number]  DOS:  04/17/2023 Type of visit - description: For BP management  Has a history of hypertension, currently not on medications due to multiple intolerances. She checks her BPs regularly and they are usually in the 130s but occasionally they go up to the 180s, 170s. When BP is high, she does not have headache, nausea, chest pain or difficulty breathing.  Went to the ER several months ago, at the time they prescribed hydralazine  to be taken as needed for elevated BP and that worked well for her.  Also has occasional palpitations described as pounding heartbeat. No associated syncope. Has a hard time describing his symptoms or telling me more details about them.  I ask about triggers for palpitations, she states is very stressed about taking care of her parents.  Review of Systems See above   Past Medical History:  Diagnosis Date   Anxiety    Depression    Hypertension     Past Surgical History:  Procedure Laterality Date   CESAREAN SECTION     #1 1978  and #2 1989    Current Outpatient Medications  Medication Instructions   Biotin 89999 MCG TABS Take by mouth.   ciclopirox  (LOPROX ) 0.77 % cream Topical, 2 times daily, Use as needed for foot rash   hyoscyamine  (LEVSIN ) 0.125 mg, Oral, Daily PRN, Max 1.5 mg in 24 hours   ibandronate  (BONIVA ) 150 MG tablet TAKE 1 TABLET BY MOUTH EVERY 30 DAYS WITH A FULL GLASS OF WATER AND ON AN EMPTY STOMACH. DO NOT TAKE ANYTHING ELSE OR LIE DOWN FOR NEXT 30 MINUTES   ibuprofen  (ADVIL ) 400 mg, Every 8 hours PRN   Multiple Vitamins-Minerals (CENTRUM SILVER 50+WOMEN PO) Take by mouth.   UNABLE TO FIND Med Name: Hempvana pain relief cream for knee pain       Objective:   Physical Exam BP (!) 142/88 (BP Location: Left Arm, Patient Position: Sitting, Cuff Size: Normal)   Pulse 78   Temp 97.9 F (36.6 C) (Oral)   Ht 5' 5 (1.651 m)   Wt 130 lb  (59 kg)   SpO2 94%   BMI 21.63 kg/m  General:   Well developed, NAD, BMI noted. HEENT:  Normocephalic . Face symmetric, atraumatic Neck: No thyromegaly Lungs:  CTA B Normal respiratory effort, no intercostal retractions, no accessory muscle use. Heart: RRR,  no murmur.  Lower extremities: no pretibial edema bilaterally  Skin: Not pale. Not jaundice Neurologic:  alert & oriented X3.  Speech normal, gait appropriate for age and unassisted Psych--  Cognition and judgment appear intact.  Cooperative with normal attention span and concentration.  Behavior appropriate. Mild to moderately anxious appearing     Assessment    73 year old female, history of Prediabetes, HTN, osteoporosis, IBS, [place] with:  HTN: Has a history of HTN, currently on no medications but reports multiple previous intolerances. Metoprolol drop her BP very low, she is intolerant to amlodipine  and losartan . Went to the ER few months ago, they tried hydralazine  to be taken as needed for elevated BP, she thinks that worked for her. Last BMP okay. Plan: Will rx hydralazine  10 mg 1 p.o. twice daily as needed.  See instructions. Low-salt diet. Avoid NSAIDs. Palpitations: In the context of anxiety, recent TSH normal. EKG today: NSR Plan: Observation for now, ER if palpitations associated with chest pain, near fainting, difficulty breathing. Anxiety: We  talk about possible treatment, she is not sure, recommend to see a counselor but she is also unsure about it. Rec to discuss further with PCP.  Medications?SABRA RTC with PCP next month

## 2023-04-17 NOTE — Patient Instructions (Addendum)
  Check the  blood pressure regularly, once or twice a day  Blood pressure goal:  between 110/65 and  135/85. If your blood pressure goes over 160, you can take hydralazine  twice daily  If you have more palpitations (heart pounding) go to the ER if severe symptoms, chest pain, nausea or sweats.  See a counselor to help you with anxiety  Come back and see your primary doctor next month.

## 2023-04-17 NOTE — Telephone Encounter (Signed)
 Copied from CRM [id number]. Topic: Clinical - Red Word Triage >> Apr 17, 2023  9:13 AM Margaret Murphy wrote: Red Word that prompted transfer to Nurse Triage: Blood pressure not being consistent / goes too high then back to normal with other feelings  Chief Complaint: Fluctuating BP Symptoms: Fluctuating BP, anxiety, pounding heart beat Frequency: A few weeks Pertinent Negatives: Patient denies chest pain and SOB Disposition: [] ED /[] Urgent Care (no appt availability in office) / [x] Appointment(In office/virtual)/ []  [facility]/ [] Home Care/ [] Refused Recommended Disposition /[] [facility]/ []  Follow-up with PCP Additional Notes: Patient called in to report fluctuating BP. Patient reported her most recent BP was 130/70. Patient stated the highest BP reading was 170-180/80 about two weeks ago. Patient is experiencing anxiety, fatigue, a headache that comes and goes, and a pounding heart beat at night. Patient denied chest pain and SOB at this time. Patient is not currently taking BP medications. Patient stated she noticed high BP readings about 2-3 months ago, but stated paying more attention within the past few weeks. Advised patient to see PCP within 3 days. Scheduled office appointment for this afternoon with Dr. Amon. Patient stated she would rather see her PCP, but no availability until next week. This RN reassured patient that her PCP would be able to see the notes/outcome of appointment today. Advised patient to call back for any severe or worsening symptoms. Patient complied.   Reason for Disposition  Systolic BP  >= 160 OR Diastolic >= 100  Answer Assessment - Initial Assessment Questions 1. BLOOD PRESSURE: What is the blood pressure? Did you take at least two measurements 5 minutes apart?     Patient states BP was 130/70 this morning, highest was 170-180/80 about 2 weeks ago  2. ONSET: When did you take your blood pressure?     Patient states this has been going on  for a few weeks  3. HOW: How did you take your blood pressure? (e.g., automatic home BP monitor, visiting nurse)     Automatic home BP monitor  4. HISTORY: Do you have a history of high blood pressure?     Patient states BP was elevated 2-3 months ago  5. MEDICINES: Are you taking any medicines for blood pressure? Have you missed any doses recently?     Patient denies  6. OTHER SYMPTOMS: Do you have any symptoms? (e.g., blurred vision, chest pain, difficulty breathing, headache, weakness)     Anxiety, pounding heart during sleep, fatigue, slight HA that comes and goes, denies chest pain and SOB  Protocols used: Blood Pressure - High-A-AH

## 2023-07-01 ENCOUNTER — Encounter: Payer: Self-pay | Admitting: Physician Assistant

## 2023-07-01 ENCOUNTER — Ambulatory Visit: Admitting: Physician Assistant

## 2023-07-01 VITALS — BP 161/78 | HR 76 | Ht 65.0 in | Wt 127.8 lb

## 2023-07-01 DIAGNOSIS — M25561 Pain in right knee: Secondary | ICD-10-CM | POA: Diagnosis not present

## 2023-07-01 MED ORDER — MELOXICAM 15 MG PO TABS: 15.0000 mg | ORAL_TABLET | Freq: Every day | ORAL | 0 refills | Status: AC

## 2023-07-01 NOTE — Progress Notes (Signed)
      Established patient visit   Patient: Margaret Murphy   DOB: June 30, 1950   73 y.o. Female  MRN: [id number] Visit Date: 07/01/2023  Today's healthcare provider: Alfredia Ferguson, PA-C   Cc. Right knee pain  Subjective     Pt reports intermittent pain of her right leg-- behind her knee, upper thigh, calf x 1 week, now more persistent around her lateral R knee. Denies injury. Taking tylenol with only limited relief. Difficulty walking  Medications: Outpatient Medications Prior to Visit  Medication Sig   Biotin 72536 MCG TABS Take by mouth.   ciclopirox (LOPROX) 0.77 % cream Apply topically 2 (two) times daily. Use as needed for foot rash   hydrALAZINE (APRESOLINE) 10 MG tablet Take 1 tablet (10 mg total) by mouth 2 (two) times daily as needed.   hyoscyamine (LEVSIN) 0.125 MG tablet Take 1 tablet (0.125 mg total) by mouth daily as needed. Max 1.5 mg in 24 hours   ibuprofen (ADVIL,MOTRIN) 200 MG tablet Take 400 mg by mouth every 8 (eight) hours as needed for fever, headache, mild pain, moderate pain or cramping.   Multiple Vitamins-Minerals (CENTRUM SILVER 50+WOMEN PO) Take by mouth.   UNABLE TO FIND Med Name: Hempvana pain relief cream for knee pain   [DISCONTINUED] ibandronate (BONIVA) 150 MG tablet TAKE 1 TABLET BY MOUTH EVERY 30 DAYS WITH A FULL GLASS OF WATER AND ON AN EMPTY STOMACH. DO NOT TAKE ANYTHING ELSE OR LIE DOWN FOR NEXT 30 MINUTES   No facility-administered medications prior to visit.    Review of Systems  Constitutional:  Negative for fatigue and fever.  Respiratory:  Negative for cough and shortness of breath.   Cardiovascular:  Negative for chest pain and leg swelling.  Gastrointestinal:  Negative for abdominal pain.  Musculoskeletal:  Positive for arthralgias.  Neurological:  Negative for dizziness and headaches.        Objective    BP (!) 161/78 Comment: home value this AM  Pulse 76   Ht 5\' 5"  (1.651 m)   Wt 127 lb 12.8 oz (58 kg)   BMI 21.27 kg/m      Physical Exam Vitals reviewed.  Constitutional:      Appearance: She is not ill-appearing.  HENT:     Head: Normocephalic.  Eyes:     Conjunctiva/sclera: Conjunctivae normal.  Cardiovascular:     Rate and Rhythm: Normal rate.  Pulmonary:     Effort: Pulmonary effort is normal. No respiratory distress.  Musculoskeletal:     Comments: Right knee with no joint effusion, full ROM, no joint laxity.  Tenderness lateral right knee Right SLR negative, Left SLR negative  Neurological:     Mental Status: She is alert and oriented to person, place, and time.  Psychiatric:        Mood and Affect: Mood normal.        Behavior: Behavior normal.     No results found for any visits on 07/01/23.  Assessment & Plan    Acute pain of right knee -     Meloxicam; Take 1 tablet (15 mg total) by mouth daily.  Dispense: 20 tablet; Refill: 0  Recommending rest ice, rx meloxicam , ok to to take tylenol as well Given compressive knee sleeve.   Return if symptoms worsen or fail to improve.      Alfredia Ferguson, PA-C  [facility] at [facility] [phone] (phone) [phone] (fax)  [facility]

## 2023-07-04 ENCOUNTER — Ambulatory Visit: Payer: Self-pay

## 2023-07-04 NOTE — Telephone Encounter (Signed)
 Chief Complaint: R knee/leg pain Symptoms: R knee/leg pain intermittently with walking Frequency: 2 wks Pertinent Negatives: Patient denies CP, SOB, redness, swelling Disposition: [] ED /[] Urgent Care (no appt availability in office) / [] Appointment(In office/virtual)/ []  [facility]/ [] Home Care/ [] Refused Recommended Disposition /[] [facility]/ [x]  Follow-up with PCP Additional Notes: Pt reports 9/10 R knee/leg pain intermittently and only with walking. Pt was seen in the office Monday 3/24 and prescribed meloxicam which she states she has been taking with Tylenol and it is not helping. Pt wondering if she needs a different medication or imaging performed to find a source of the pain.  Since pt was just seen Monday, RN called the CAL to see if pt needs another appt. CAL said it depends on the provider. RN advised the pt RN would send a message to the appropriate people for follow-up. Pt would like follow-up before the EOD today.  Pt denies swelling, redness, a knot under the skin, skin color change, and numbness and tingling. Denies CP or SOB. RN advised pt if she develops any of the above to go to the ED to which she verbalized understanding.    Summary: Pain In Knee   Copied From CRM [id number]. Reason for Triage: Patient seen Frances Nickels this past Monday, says meloxicam medication is not working and wants to know what she should do     Answer Assessment - Initial Assessment Questions 1. ONSET: "When did the pain start?"      Seen 3/24 (Monday) - started last week 2. LOCATION: "Where is the pain located?"      R leg - knee, side of knee, behind knee 3. PAIN: "How bad is the pain?"    (Scale 1-10; or mild, moderate, severe)   -  MILD (1-3): doesn't interfere with normal activities    -  MODERATE (4-7): interferes with normal activities (e.g., work or school) or awakens from sleep, limping    -  SEVERE (8-10): excruciating pain, unable to do any normal activities, unable  to walk     No pain sitting, only with walking - pain is a 9/10 with walking, however it is intermittent 4. WORK OR EXERCISE: "Has there been any recent work or exercise that involved this part of the body?"      No 5. CAUSE: "What do you think is causing the leg pain?"     Not sure  6. OTHER SYMPTOMS: "Do you have any other symptoms?" (e.g., chest pain, back pain, breathing difficulty, swelling, rash, fever, numbness, weakness)     Has been taking meloxicam since Monday (prescribed on 3/24) along with extra strength Tylenol. Not helping. No swelling. No skin color change. Denies numbness/tingling. Denies fever, denies chills. Denies SOB. Denies CP. Stats her BP was high in the office.  Protocols used: Leg Pain-A-AH

## 2024-02-29 NOTE — Progress Notes (Signed)
 [facility] at [facility] [employer] [address], [address] [place], [place] [address] [phone]  Date:  03/02/2024   Name:  Margaret Murphy   DOB:  06-Sep-1950   MRN:  [id number]  PCP:  Watt Harlene BROCKS, MD    Chief Complaint: Annual Exam   History of Present Illness:  Margaret Murphy is a 73 y.o. very pleasant female patient who presents with the following:  Patient seen today for physical exam I saw her most recently 1 year ago also for her physical History of prediabetes and HTN, osteoporosis, IBS   When I saw her last year she is no longer taking Boniva  due to side effects She enjoys walking and gardening Mammogram- scheduled  DEXA scan-would like to update this year- she thinks this is scheduled already  Cologuard-due for update Can offer flu shot- offered, she declines today  Labs on chart from 1 year ago She has completed pneumonia vaccine  BP Readings from Last 3 Encounters:  03/02/24 (!) 144/92  07/01/23 (!) 161/78  04/17/23 (!) 142/88    Discussed the use of AI scribe software for clinical note transcription with the patient, who gave verbal consent to proceed.  History of Present Illness Margaret Murphy is a 73 year old female who presents for a follow-up visit to discuss blood pressure management and routine screenings.  She experiences elevated blood pressure, particularly during periods of stress related to caregiving for her elderly parents. She describes episodes where her blood pressure significantly increases due to stress. She has been prescribed hydralazine  for as-needed use but has only taken it twice. Previous trials of amlodipine  and losartan  resulted in hypotension, impairing her ability to function.  She has a family history of hypertension. She is concerned about the impact of her blood pressure on daily activities, especially when driving long distances to care for her parents.  She has completed her  pneumonia vaccination but has not received her flu shot this year due to a previous adverse reaction, which included flu-like symptoms shortly after vaccination.  She is scheduled for a bone density test and a mammogram. She practices breathing exercises and meditation to manage stress, particularly when anticipating long drives to visit her parents. Her father resides in a veterans facility, and her mother is at home, necessitating frequent travel.  No chest pain or difficulty breathing.   Patient Active Problem List   Diagnosis Date Noted   Osteoporosis 01/08/2018   Pre-diabetes 05/12/2015   HTN (hypertension) 12/19/2011   Depression with anxiety 12/19/2011   Hypercholesteremia 12/19/2011   History of urinary tract infection 12/19/2011    Past Medical History:  Diagnosis Date   Anxiety    Depression    Hypertension     Past Surgical History:  Procedure Laterality Date   CESAREAN SECTION     #1 1978  and #2 1989    Social History   Tobacco Use   Smoking status: Never   Smokeless tobacco: Never  Substance Use Topics   Alcohol use: No   Drug use: No    Family History  Problem Relation Age of Onset   Hypertension Mother        dx in 58's- not sure of age   Diabetes Mother    Hypertension Maternal Grandmother        does not know age of dx   Glaucoma Father    Hypertension Sister    Diabetes Brother  Cancer Maternal Aunt 78       Breast cancer   Hypertension Sister    Hypertension Maternal Uncle     Allergies  Allergen Reactions   Diuretic [Buchu-Cornsilk-Ch Grass-Hydran] Other (See Comments)    cramps   Fosamax  [Alendronate ] Other (See Comments)    Sharp abdominal pain, slowed urine stream, sore throat    Metoprolol Nausea And Vomiting   Sulfonamide Derivatives Other (See Comments)    GI UPSET    Medication list has been reviewed and updated.  Current Outpatient Medications on File Prior to Visit  Medication Sig Dispense Refill   Biotin 89999  MCG TABS Take by mouth.     ciclopirox  (LOPROX ) 0.77 % cream Apply topically 2 (two) times daily. Use as needed for foot rash 30 g 1   hydrALAZINE  (APRESOLINE ) 10 MG tablet Take 1 tablet (10 mg total) by mouth 2 (two) times daily as needed. 60 tablet 0   hyoscyamine  (LEVSIN ) 0.125 MG tablet Take 1 tablet (0.125 mg total) by mouth daily as needed. Max 1.5 mg in 24 hours 60 tablet 2   ibuprofen  (ADVIL ,MOTRIN ) 200 MG tablet Take 400 mg by mouth every 8 (eight) hours as needed for fever, headache, mild pain, moderate pain or cramping.     meloxicam  (MOBIC ) 15 MG tablet Take 1 tablet (15 mg total) by mouth daily. 20 tablet 0   Multiple Vitamins-Minerals (CENTRUM SILVER 50+WOMEN PO) Take by mouth.     UNABLE TO FIND Med Name: Hempvana pain relief cream for knee pain     No current facility-administered medications on file prior to visit.    Review of Systems:  As per HPI- otherwise negative.   Physical Examination: Vitals:   03/02/24 0939  BP: (!) 144/92  Pulse: 98  Temp: 97.9 F (36.6 C)  SpO2: 99%   Vitals:   03/02/24 0939  Weight: 125 lb 12.8 oz (57.1 kg)  Height: 5' 5 (1.651 m)   Body mass index is 20.93 kg/m. Ideal Body Weight: Weight in (lb) to have BMI = 25: 149.9  GEN: no acute distress.  Normal weight, looks well  HEENT: Atraumatic, Normocephalic.  Ears and Nose: No external deformity. CV: RRR, No M/G/R. No JVD. No thrill. No extra heart sounds. PULM: CTA B, no wheezes, crackles, rhonchi. No retractions. No resp. distress. No accessory muscle use. ABD: S, NT, ND, +BS. No rebound. No HSM. EXTR: No c/c/e PSYCH: Normally interactive. Conversant.    Assessment and Plan: Physical exam  Vitamin D  deficiency  Hypercholesteremia - Plan: Lipid panel  Age-related osteoporosis without current pathological fracture - Plan: DG Bone Density  Hypertension, unspecified type - Plan: CBC, Comprehensive metabolic panel with GFR, hydrochlorothiazide  (HYDRODIURIL ) 25 MG  tablet  Prediabetes - Plan: Hemoglobin A1c  Mild anemia - Plan: CBC  Thyroid  disorder screening - Plan: TSH  Screening for colon cancer - Plan: Cologuard  Assessment & Plan Adult Wellness Visit Routine wellness visit focused on health maintenance and preventive care. - Ordered blood work: blood counts, metabolic profile, A1c, cholesterol, thyroid  function tests. - Ordered Cologuard for colon cancer screening.  Essential hypertension Hypertension with recent elevated readings, likely stress-related. Previous medications caused hypotension. Discussed hydrochlorothiazide  as a diuretic option. - Prescribed hydrochlorothiazide  25 mg, start with half tablet daily as needed for BP >140/85 mmHg. - Advised to monitor BP and adjust medication based on readings. - Discussed potential side effects of hydrochlorothiazide , including increased urination. - Encouraged breathing exercises and meditation for stress management.  Osteoporosis Previous  Boniva  discontinued due to side effects. Bone density assessment needed. - Ordered bone density scan. - Coordinated with [facility] to schedule bone density scan with mammogram.  Signed Harlene Schroeder, MD  Received labs as below, message to patient Results for orders placed or performed in visit on 03/02/24  CBC   Collection Time: 03/02/24 10:16 AM  Result Value Ref Range   WBC 3.4 (L) 4.0 - 10.5 K/uL   RBC 4.18 3.87 - 5.11 Mil/uL   Platelets 242.0 150.0 - 400.0 K/uL   Hemoglobin 12.1 12.0 - 15.0 g/dL   HCT 62.8 63.9 - 53.9 %   MCV 88.7 78.0 - 100.0 fl   MCHC 32.6 30.0 - 36.0 g/dL   RDW 86.2 88.4 - 84.4 %  Comprehensive metabolic panel with GFR   Collection Time: 03/02/24 10:16 AM  Result Value Ref Range   Sodium 140 135 - 145 mEq/L   Potassium 3.9 3.5 - 5.1 mEq/L   Chloride 105 96 - 112 mEq/L   CO2 29 19 - 32 mEq/L   Glucose, Bld 93 70 - 99 mg/dL   BUN 18 6 - 23 mg/dL   Creatinine, Ser 9.25 0.40 - 1.20 mg/dL   Total Bilirubin 0.5 0.2 -  1.2 mg/dL   Alkaline Phosphatase 45 39 - 117 U/L   AST 19 0 - 37 U/L   ALT 15 0 - 35 U/L   Total Protein 7.2 6.0 - 8.3 g/dL   Albumin 4.5 3.5 - 5.2 g/dL   GFR 19.51 >39.99 mL/min   Calcium  9.8 8.4 - 10.5 mg/dL  Hemoglobin J8r   Collection Time: 03/02/24 10:16 AM  Result Value Ref Range   Hgb A1c MFr Bld 5.7 4.6 - 6.5 %  Lipid panel   Collection Time: 03/02/24 10:16 AM  Result Value Ref Range   Cholesterol 243 (H) 0 - 200 mg/dL   Triglycerides 24.9 0.0 - 149.0 mg/dL   HDL 22.99 >60.99 mg/dL   VLDL 84.9 0.0 - 59.9 mg/dL   LDL Cholesterol 848 (H) 0 - 99 mg/dL   Total CHOL/HDL Ratio 3    NonHDL 165.53   TSH   Collection Time: 03/02/24 10:16 AM  Result Value Ref Range   TSH 1.65 0.35 - 5.50 uIU/mL    "

## 2024-02-29 NOTE — Patient Instructions (Signed)
 It was great to see you again today I will be in touch with your labs I will send you a Cologuard kit, we will arrange for a bone density scan Recommend mammogram at least every 2 years Recommend flu shot, COVID booster, shingles vaccine series/Shingrix  We will start some hydrochlorothiazide  for your elevated BP; recommend taking if your am blood pressure is over 140/85.

## 2024-03-02 ENCOUNTER — Ambulatory Visit (INDEPENDENT_AMBULATORY_CARE_PROVIDER_SITE_OTHER): Admitting: Family Medicine

## 2024-03-02 ENCOUNTER — Encounter: Payer: Self-pay | Admitting: Family Medicine

## 2024-03-02 VITALS — BP 144/92 | HR 98 | Temp 97.9°F | Ht 65.0 in | Wt 125.8 lb

## 2024-03-02 DIAGNOSIS — Z1211 Encounter for screening for malignant neoplasm of colon: Secondary | ICD-10-CM | POA: Diagnosis not present

## 2024-03-02 DIAGNOSIS — E7800 Pure hypercholesterolemia, unspecified: Secondary | ICD-10-CM

## 2024-03-02 DIAGNOSIS — Z1329 Encounter for screening for other suspected endocrine disorder: Secondary | ICD-10-CM | POA: Diagnosis not present

## 2024-03-02 DIAGNOSIS — D649 Anemia, unspecified: Secondary | ICD-10-CM

## 2024-03-02 DIAGNOSIS — Z0000 Encounter for general adult medical examination without abnormal findings: Secondary | ICD-10-CM

## 2024-03-02 DIAGNOSIS — R7303 Prediabetes: Secondary | ICD-10-CM

## 2024-03-02 DIAGNOSIS — E559 Vitamin D deficiency, unspecified: Secondary | ICD-10-CM

## 2024-03-02 DIAGNOSIS — I10 Essential (primary) hypertension: Secondary | ICD-10-CM

## 2024-03-02 DIAGNOSIS — M810 Age-related osteoporosis without current pathological fracture: Secondary | ICD-10-CM

## 2024-03-02 LAB — CBC
HCT: 37.1 % (ref 36.0–46.0)
Hemoglobin: 12.1 g/dL (ref 12.0–15.0)
MCHC: 32.6 g/dL (ref 30.0–36.0)
MCV: 88.7 fl (ref 78.0–100.0)
Platelets: 242 K/uL (ref 150.0–400.0)
RBC: 4.18 Mil/uL (ref 3.87–5.11)
RDW: 13.7 % (ref 11.5–15.5)
WBC: 3.4 K/uL — ABNORMAL LOW (ref 4.0–10.5)

## 2024-03-02 LAB — LIPID PANEL
Cholesterol: 243 mg/dL — ABNORMAL HIGH (ref 0–200)
HDL: 77 mg/dL (ref >39.00)
LDL Cholesterol: 151 mg/dL — ABNORMAL HIGH (ref 0–99)
NonHDL: 165.53
Total CHOL/HDL Ratio: 3
Triglycerides: 75 mg/dL (ref 0.0–149.0)
VLDL: 15 mg/dL (ref 0.0–40.0)

## 2024-03-02 LAB — COMPREHENSIVE METABOLIC PANEL WITH GFR
ALT: 15 U/L (ref 0–35)
AST: 19 U/L (ref 0–37)
Albumin: 4.5 g/dL (ref 3.5–5.2)
Alkaline Phosphatase: 45 U/L (ref 39–117)
BUN: 18 mg/dL (ref 6–23)
CO2: 29 meq/L (ref 19–32)
Calcium: 9.8 mg/dL (ref 8.4–10.5)
Chloride: 105 meq/L (ref 96–112)
Creatinine, Ser: 0.74 mg/dL (ref 0.40–1.20)
GFR: 80.48 mL/min (ref >60.00)
Glucose, Bld: 93 mg/dL (ref 70–99)
Potassium: 3.9 meq/L (ref 3.5–5.1)
Sodium: 140 meq/L (ref 135–145)
Total Bilirubin: 0.5 mg/dL (ref 0.2–1.2)
Total Protein: 7.2 g/dL (ref 6.0–8.3)

## 2024-03-02 LAB — HEMOGLOBIN A1C: Hgb A1c MFr Bld: 5.7 % (ref 4.6–6.5)

## 2024-03-02 LAB — TSH: TSH: 1.65 u[IU]/mL (ref 0.35–5.50)

## 2024-03-02 MED ORDER — HYDROCHLOROTHIAZIDE 25 MG PO TABS: 12.5000 mg | ORAL_TABLET | Freq: Every day | ORAL | 3 refills | Status: AC

## 2024-03-03 MED ORDER — ROSUVASTATIN CALCIUM 10 MG PO TABS: 10.0000 mg | ORAL_TABLET | Freq: Every day | ORAL | 3 refills | Status: AC

## 2024-03-11 DIAGNOSIS — M810 Age-related osteoporosis without current pathological fracture: Secondary | ICD-10-CM | POA: Diagnosis not present

## 2024-03-11 DIAGNOSIS — Z1231 Encounter for screening mammogram for malignant neoplasm of breast: Secondary | ICD-10-CM | POA: Diagnosis not present

## 2024-03-11 LAB — HM DEXA SCAN

## 2024-03-11 LAB — HM MAMMOGRAPHY

## 2024-03-12 ENCOUNTER — Encounter: Payer: Self-pay | Admitting: Family Medicine

## 2024-03-22 LAB — COLOGUARD: COLOGUARD: NEGATIVE

## 2024-03-23 ENCOUNTER — Encounter: Payer: Self-pay | Admitting: Family Medicine

## 2024-03-24 ENCOUNTER — Encounter: Payer: Self-pay | Admitting: Family Medicine
